# Patient Record
Sex: Female | Born: 1975 | Race: White | Hispanic: No | Marital: Married | Smoking: Never smoker
Health system: Southern US, Community
[De-identification: ages and names within clinical notes are randomized; demographics above are authoritative.]

## PROBLEM LIST (undated history)

## (undated) DIAGNOSIS — E785 Hyperlipidemia, unspecified: Secondary | ICD-10-CM

## (undated) DIAGNOSIS — C49A0 Gastrointestinal stromal tumor, unspecified site: Secondary | ICD-10-CM

## (undated) DIAGNOSIS — E119 Type 2 diabetes mellitus without complications: Secondary | ICD-10-CM

## (undated) HISTORY — DX: Type 2 diabetes mellitus without complications: E11.9

## (undated) HISTORY — PX: TONSILLECTOMY AND ADENOIDECTOMY: SUR1326

## (undated) HISTORY — PX: MYRINGOTOMY WITH TUBE PLACEMENT: SHX5663

## (undated) HISTORY — DX: Hyperlipidemia, unspecified: E78.5

---
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
## 2004-11-26 ENCOUNTER — Ambulatory Visit: Payer: Self-pay | Admitting: Family Medicine

## 2005-11-05 ENCOUNTER — Ambulatory Visit: Payer: Self-pay | Admitting: Family Medicine

## 2008-04-05 ENCOUNTER — Ambulatory Visit: Payer: Self-pay | Admitting: Internal Medicine

## 2008-04-05 DIAGNOSIS — R030 Elevated blood-pressure reading, without diagnosis of hypertension: Secondary | ICD-10-CM | POA: Insufficient documentation

## 2008-06-15 ENCOUNTER — Ambulatory Visit: Payer: Self-pay | Admitting: Family Medicine

## 2008-06-15 DIAGNOSIS — R109 Unspecified abdominal pain: Secondary | ICD-10-CM

## 2008-06-15 LAB — CONVERTED CEMR LAB
Beta hcg, urine, semiquantitative: NEGATIVE
Ketones, urine, test strip: NEGATIVE
Nitrite: NEGATIVE
Specific Gravity, Urine: <1.005
Urobilinogen, UA: NEGATIVE
WBC Urine, dipstick: NEGATIVE

## 2008-06-17 ENCOUNTER — Encounter: Payer: Self-pay | Admitting: Family Medicine

## 2008-06-19 ENCOUNTER — Encounter: Admission: RE | Admit: 2008-06-19 | Discharge: 2008-06-19 | Payer: Self-pay | Admitting: Family Medicine

## 2008-06-21 ENCOUNTER — Telehealth (INDEPENDENT_AMBULATORY_CARE_PROVIDER_SITE_OTHER): Payer: Self-pay | Admitting: *Deleted

## 2008-06-23 ENCOUNTER — Encounter (INDEPENDENT_AMBULATORY_CARE_PROVIDER_SITE_OTHER): Payer: Self-pay | Admitting: *Deleted

## 2008-06-30 ENCOUNTER — Encounter: Payer: Self-pay | Admitting: Family Medicine

## 2008-06-30 ENCOUNTER — Ambulatory Visit: Payer: Self-pay | Admitting: Family Medicine

## 2008-06-30 ENCOUNTER — Other Ambulatory Visit: Admission: RE | Admit: 2008-06-30 | Discharge: 2008-06-30 | Payer: Self-pay | Admitting: Family Medicine

## 2008-06-30 LAB — CONVERTED CEMR LAB
ALT: 20 units/L (ref 0–35)
Albumin: 3.8 g/dL (ref 3.5–5.2)
Alkaline Phosphatase: 84 units/L (ref 39–117)
Basophils Relative: 0.5 % (ref 0.0–3.0)
Bilirubin, Direct: 0 mg/dL (ref 0.0–0.3)
CO2: 28 meq/L (ref 19–32)
Chloride: 106 meq/L (ref 96–112)
Creatinine, Ser: 0.7 mg/dL (ref 0.4–1.2)
Eosinophils Relative: 0.8 % (ref 0.0–5.0)
Hemoglobin: 13.3 g/dL (ref 12.0–15.0)
LDL Cholesterol: 133 mg/dL — ABNORMAL HIGH (ref 0–99)
Lymphocytes Relative: 24.2 % (ref 12.0–46.0)
MCHC: 33.3 g/dL (ref 30.0–36.0)
MCV: 78.8 fL (ref 78.0–100.0)
Monocytes Absolute: 0.5 10*3/uL (ref 0.1–1.0)
Neutro Abs: 5.1 10*3/uL (ref 1.4–7.7)
Neutrophils Relative %: 67.7 % (ref 43.0–77.0)
RBC: 5.09 M/uL (ref 3.87–5.11)
Sodium: 140 meq/L (ref 135–145)
Total CHOL/HDL Ratio: 7
Total Protein: 7.3 g/dL (ref 6.0–8.3)
Triglycerides: 129 mg/dL (ref 0.0–149.0)
WBC: 7.5 10*3/uL (ref 4.5–10.5)

## 2008-07-03 ENCOUNTER — Encounter (INDEPENDENT_AMBULATORY_CARE_PROVIDER_SITE_OTHER): Payer: Self-pay | Admitting: *Deleted

## 2008-07-04 ENCOUNTER — Encounter (INDEPENDENT_AMBULATORY_CARE_PROVIDER_SITE_OTHER): Payer: Self-pay | Admitting: *Deleted

## 2008-07-07 ENCOUNTER — Ambulatory Visit: Payer: Self-pay | Admitting: Family Medicine

## 2008-08-04 ENCOUNTER — Ambulatory Visit: Payer: Self-pay | Admitting: Family Medicine

## 2010-08-17 ENCOUNTER — Encounter: Payer: Self-pay | Admitting: Internal Medicine

## 2010-08-27 ENCOUNTER — Encounter: Payer: Self-pay | Admitting: Family Medicine

## 2010-08-30 ENCOUNTER — Ambulatory Visit (INDEPENDENT_AMBULATORY_CARE_PROVIDER_SITE_OTHER): Payer: Managed Care, Other (non HMO) | Admitting: Family Medicine

## 2010-08-30 ENCOUNTER — Encounter: Payer: Self-pay | Admitting: Family Medicine

## 2010-08-30 DIAGNOSIS — R319 Hematuria, unspecified: Secondary | ICD-10-CM

## 2010-08-30 DIAGNOSIS — Z0000 Encounter for general adult medical examination without abnormal findings: Secondary | ICD-10-CM

## 2010-08-30 DIAGNOSIS — R002 Palpitations: Secondary | ICD-10-CM

## 2010-08-30 LAB — LIPID PANEL
HDL: 34.1 mg/dL — ABNORMAL LOW (ref >39.00)
LDL Cholesterol: 121 mg/dL — ABNORMAL HIGH (ref 0–99)
VLDL: 27.4 mg/dL (ref 0.0–40.0)

## 2010-08-30 LAB — CBC WITH DIFFERENTIAL/PLATELET
Basophils Absolute: 0 10*3/uL (ref 0.0–0.1)
Eosinophils Absolute: 0.1 10*3/uL (ref 0.0–0.7)
Lymphocytes Relative: 24.1 % (ref 12.0–46.0)
MCHC: 33.5 g/dL (ref 30.0–36.0)
Neutro Abs: 5.1 10*3/uL (ref 1.4–7.7)
Neutrophils Relative %: 68.2 % (ref 43.0–77.0)
Platelets: 281 10*3/uL (ref 150.0–400.0)
RDW: 17.8 % — ABNORMAL HIGH (ref 11.5–14.6)

## 2010-08-30 LAB — HEPATIC FUNCTION PANEL
Albumin: 3.6 g/dL (ref 3.5–5.2)
Alkaline Phosphatase: 86 U/L (ref 39–117)
Bilirubin, Direct: 0.1 mg/dL (ref 0.0–0.3)
Total Bilirubin: 0.4 mg/dL (ref 0.3–1.2)

## 2010-08-30 LAB — BASIC METABOLIC PANEL
CO2: 27 mEq/L (ref 19–32)
Calcium: 8.9 mg/dL (ref 8.4–10.5)
Creatinine, Ser: 0.5 mg/dL (ref 0.4–1.2)
GFR: 136.35 mL/min (ref >60.00)
Glucose, Bld: 82 mg/dL (ref 70–99)

## 2010-08-30 LAB — POCT URINALYSIS DIPSTICK
Ketones, UA: NEGATIVE
Leukocytes, UA: NEGATIVE
Protein, UA: NEGATIVE
pH, UA: 5

## 2010-08-30 NOTE — Progress Notes (Signed)
Addended by: Legrand Como on: 08/30/2010 10:23 AM   Modules accepted: Orders

## 2010-08-30 NOTE — Patient Instructions (Signed)

## 2010-08-30 NOTE — Progress Notes (Signed)
  Subjective:     Sarah Fernandez is a 35 y.o. female and is here for a comprehensive physical exam. The patient reports problems - + palpatations last week---for about 12 hours.  Pt was under stress and she thinks it was a panic attack..  History   Social History  . Marital Status: Married    Spouse Name: N/A    Number of Children: N/A  . Years of Education: N/A   Occupational History  . Not on file.   Social History Main Topics  . Smoking status: Never Smoker   . Smokeless tobacco: Never Used  . Alcohol Use: No  . Drug Use: No  . Sexually Active: Yes -- Female partner(s)   Other Topics Concern  . Not on file   Social History Narrative  . No narrative on file   Health Maintenance  Topic Date Due  . Pap Smear  07/01/2011  . Tetanus/tdap  06/19/2015    The following portions of the patient's history were reviewed and updated as appropriate: allergies, current medications, past family history, past medical history, past social history, past surgical history and problem list.  Review of Systems Review of Systems  Constitutional: Negative for activity change, appetite change and fatigue.  HENT: Negative for hearing loss, congestion, tinnitus and ear discharge.  dentist-- due Eyes: Negative for visual disturbance (see optho q1y -- vision 20/20).  Respiratory: Negative for cough, chest tightness and shortness of breath.   Cardiovascular: Negative for chest pain, edema----+ palpatations last week. Gastrointestinal: Negative for abdominal pain, diarrhea, constipation and abdominal distention.  Genitourinary: Negative for urgency, frequency, decreased urine volume and difficulty urinating.  Musculoskeletal: Negative for back pain, arthralgias and gait problem.  Skin: Negative for color change, pallor and rash.  Neurological: Negative for dizziness, light-headedness, numbness and headaches.  Hematological: Negative for adenopathy. Does not bruise/bleed easily.  Psychiatric/Behavioral:  Negative for suicidal ideas, confusion, sleep disturbance, self-injury, dysphoric mood, decreased concentration and agitation.      Objective:    BP 118/74  Pulse 80  Temp(Src) 97.9 F (36.6 C) (Oral)  Ht 5' 4.75" (1.645 m)  Wt 213 lb (96.616 kg)  BMI 35.72 kg/m2  SpO2 96%  LMP 08/30/2010 General appearance: alert, cooperative, appears stated age, no distress and moderately obese Head: Normocephalic, without obvious abnormality, atraumatic Eyes: conjunctivae/corneas clear. PERRL, EOM's intact. Fundi benign. Ears: normal TM's and external ear canals both ears Nose: Nares normal. Septum midline. Mucosa normal. No drainage or sinus tenderness. Throat: lips, mucosa, and tongue normal; teeth and gums normal Neck: no adenopathy, no carotid bruit, no JVD, supple, symmetrical, trachea midline and thyroid not enlarged, symmetric, no tenderness/mass/nodules Lungs: clear to auscultation bilaterally Breasts: normal appearance, no masses or tenderness Heart: regular rate and rhythm, S1, S2 normal, no murmur, click, rub or gallop Abdomen: soft, non-tender; bowel sounds normal; no masses,  no organomegaly Pelvic: deferred --pt on period Extremities: extremities normal, atraumatic, no cyanosis or edema Pulses: 2+ and symmetric Skin: Skin color, texture, turgor normal. No rashes or lesions Lymph nodes: Cervical, supraclavicular, and axillary nodes normal. Neurologic: Grossly normal psych-- no depression or anxiety today---pt states she does get anxious    Assessment:    Healthy female exam.  palpatations---? anxiety   Plan:  ghm utd Check fasting labs If pt develops cp or increasing palp--go to er   See After Visit Summary for Counseling Recommendations

## 2010-09-11 ENCOUNTER — Other Ambulatory Visit: Payer: Self-pay | Admitting: Family Medicine

## 2010-09-25 ENCOUNTER — Encounter: Payer: Self-pay | Admitting: Family Medicine

## 2010-10-03 ENCOUNTER — Other Ambulatory Visit: Payer: Self-pay | Admitting: Internal Medicine

## 2010-10-03 ENCOUNTER — Telehealth: Payer: Self-pay | Admitting: Internal Medicine

## 2010-10-03 DIAGNOSIS — Z23 Encounter for immunization: Secondary | ICD-10-CM

## 2010-10-03 NOTE — Telephone Encounter (Signed)
Informed pt she had 3 in 2010, we could redo the series but she would not be covered before the trip-- pt wants to have Hep A & B antibody done to see if she immune from the shots given in 2010.

## 2010-10-04 ENCOUNTER — Other Ambulatory Visit (INDEPENDENT_AMBULATORY_CARE_PROVIDER_SITE_OTHER): Payer: Managed Care, Other (non HMO)

## 2010-10-04 DIAGNOSIS — Z23 Encounter for immunization: Secondary | ICD-10-CM

## 2010-10-04 NOTE — Progress Notes (Signed)
LABS ONLY  

## 2010-10-05 LAB — HEPATITIS B SURFACE ANTIBODY,QUALITATIVE

## 2010-10-05 LAB — HEPATITIS A ANTIBODY, IGM: Hep A IgM: NEGATIVE

## 2010-10-07 ENCOUNTER — Ambulatory Visit: Payer: Managed Care, Other (non HMO) | Admitting: Family Medicine

## 2010-10-07 ENCOUNTER — Telehealth: Payer: Self-pay

## 2010-10-07 MED ORDER — ACETAZOLAMIDE 250 MG PO TABS: ORAL_TABLET | ORAL | Status: DC

## 2010-10-07 NOTE — Telephone Encounter (Signed)
Pt states that she will be gone from this Wednesday to next Wednesday. Pt aware Rx sent in to pharmacy.

## 2010-10-07 NOTE — Telephone Encounter (Signed)
Diamox 250 mg twice a day beginning one day prior to ascent  and continuing for at least 5 days while at high altitude. umber of pills will need to be calculated based on  length of time at  high altitude. LABS: LDL is mildly elevated. Please review Dr Gildardo Griffes book Eat, Drink & Be Healthy for dietary cholesterol information.  HDL goal = > 50.Interventions to raise HDL or GOOD cholesterol include: exercising 30-45 minutes 3-4 X per week; including salmon & tuna in the diet;  & supplementing with Omega 3 fatty acids (Flax or Fish oil )  1-2 grams per day. The B vitamin Niacin also raises HDL but has a vasodilating effect which may cause flushing. MCV & RDW measure size of red cells ;these are  not important unless anemia is present.

## 2010-10-07 NOTE — Telephone Encounter (Signed)
Call from patient stating she is going on a mission trip on Wednesday and needs and RX for Diamox  for high altitude and something for nausea, she wanted to know should she take the Diamox or just Gingko Biloba. She also needs lab results. Please advise    KP

## 2010-10-08 ENCOUNTER — Ambulatory Visit (INDEPENDENT_AMBULATORY_CARE_PROVIDER_SITE_OTHER): Payer: Managed Care, Other (non HMO)

## 2010-10-08 DIAGNOSIS — Z23 Encounter for immunization: Secondary | ICD-10-CM

## 2010-10-09 ENCOUNTER — Ambulatory Visit: Payer: Managed Care, Other (non HMO)

## 2010-11-08 ENCOUNTER — Ambulatory Visit: Payer: Managed Care, Other (non HMO) | Admitting: Family Medicine

## 2010-12-10 ENCOUNTER — Ambulatory Visit (INDEPENDENT_AMBULATORY_CARE_PROVIDER_SITE_OTHER): Payer: Managed Care, Other (non HMO) | Admitting: Family Medicine

## 2010-12-10 ENCOUNTER — Encounter: Payer: Self-pay | Admitting: Family Medicine

## 2010-12-10 ENCOUNTER — Other Ambulatory Visit (HOSPITAL_COMMUNITY)
Admission: RE | Admit: 2010-12-10 | Discharge: 2010-12-10 | Disposition: A | Discharge status: Home / Self Care | Payer: Managed Care, Other (non HMO) | Source: Ambulatory Visit | Attending: Family Medicine | Admitting: Family Medicine

## 2010-12-10 VITALS — BP 120/90 | HR 77 | Temp 98.7°F | Ht 65.0 in | Wt 214.2 lb

## 2010-12-10 DIAGNOSIS — Z01419 Encounter for gynecological examination (general) (routine) without abnormal findings: Secondary | ICD-10-CM

## 2010-12-10 DIAGNOSIS — Z23 Encounter for immunization: Secondary | ICD-10-CM

## 2010-12-10 NOTE — Patient Instructions (Signed)
We will mail you a letter when we receive your pap smear results

## 2010-12-10 NOTE — Progress Notes (Signed)
  Subjective:     Sarah Fernandez is a 35 y.o. woman who comes in today for a  pap smear only. Her most recent annual exam was in June. Her most recent Pap smear was a year ago and showed no abnormalities. Previous abnormal Pap smears: no. Contraception: none  The following portions of the patient's history were reviewed and updated as appropriate: allergies, current medications, past family history, past medical history, past social history, past surgical history and problem list.  Review of Systems Pertinent items are noted in HPI.   Objective:    BP 120/90  Pulse 77  Temp(Src) 98.7 F (37.1 C) (Oral)  Ht 5\' 5"  (1.651 m)  Wt 214 lb 3.2 oz (97.16 kg)  BMI 35.64 kg/m2  SpO2 95%  LMP 11/30/2010 Pelvic Exam: cervix normal in appearance, external genitalia normal, no adnexal masses or tenderness, no bladder tenderness, no cervical motion tenderness, uterus normal size, shape, and consistency and vagina normal without discharge. Pap smear obtained.   Assessment:    Screening pap smear.   Plan:    Follow up in 1 year, or as indicated by Pap results.

## 2010-12-12 ENCOUNTER — Encounter: Payer: Self-pay | Admitting: *Deleted

## 2011-04-04 ENCOUNTER — Encounter: Payer: Self-pay | Admitting: Family Medicine

## 2011-04-04 ENCOUNTER — Ambulatory Visit (INDEPENDENT_AMBULATORY_CARE_PROVIDER_SITE_OTHER): Payer: Managed Care, Other (non HMO) | Admitting: Family Medicine

## 2011-04-04 VITALS — BP 114/72 | HR 91 | Temp 98.8°F | Wt 217.4 lb

## 2011-04-04 DIAGNOSIS — H7290 Unspecified perforation of tympanic membrane, unspecified ear: Secondary | ICD-10-CM

## 2011-04-04 DIAGNOSIS — J329 Chronic sinusitis, unspecified: Secondary | ICD-10-CM

## 2011-04-04 MED ORDER — AZELASTINE-FLUTICASONE 137-50 MCG/ACT NA SUSP: 1.0000 | Freq: Two times a day (BID) | NASAL | Status: DC

## 2011-04-04 MED ORDER — AMOXICILLIN-POT CLAVULANATE 875-125 MG PO TABS: 1.0000 | ORAL_TABLET | Freq: Two times a day (BID) | ORAL | Status: AC

## 2011-04-04 NOTE — Patient Instructions (Signed)

## 2011-04-04 NOTE — Progress Notes (Signed)
  Subjective:     Sarah Fernandez is a 36 y.o. female who presents for evaluation of sinus pain. Symptoms include: congestion, cough, headaches, nasal congestion and sinus pressure. Onset of symptoms was 1 month ago. Symptoms have been gradually worsening since that time. Past history is significant for no history of pneumonia or bronchitis. Patient is a non-smoker.  Pt states zyrtec is not working.  She is also using neti pot and delsym with no relief.  The following portions of the patient's history were reviewed and updated as appropriate: allergies, current medications, past family history, past medical history, past social history, past surgical history and problem list.  Review of Systems Pertinent items are noted in HPI.   Objective:    BP 114/72  Pulse 91  Temp(Src) 98.8 F (37.1 C) (Oral)  Wt 217 lb 6.4 oz (98.612 kg)  SpO2 96% General appearance: alert, cooperative, appears stated age and no distress Ears: abnormal TM left ear - perforation: - - Nose: blood tinged and green discharge, moderate congestion, sinus tenderness bilateral Throat: lips, mucosa, and tongue normal; teeth and gums normal Neck: no adenopathy and thyroid not enlarged, symmetric, no tenderness/mass/nodules Lungs: clear to auscultation bilaterally Extremities: extremities normal, atraumatic, no cyanosis or edema    Assessment:    Acute bacterial sinusitis.   ruptured tm---refer to Ent for eval Plan:    Neti pot recommended. Instructions given. Nasal steroids per medication orders. Antihistamines per medication orders. Augmentin per medication orders. Referred to ENT. Follow up in 4 days or as needed. ----only if needed

## 2011-04-16 ENCOUNTER — Telehealth: Payer: Self-pay | Admitting: Family Medicine

## 2011-04-16 NOTE — Telephone Encounter (Signed)
Would try Nasonex- 2 sprays each nostril daily.  Can have sample

## 2011-04-16 NOTE — Telephone Encounter (Signed)
The nasal spray burns her nose, do you have any other suggestions for nasal sprays?

## 2011-04-16 NOTE — Telephone Encounter (Signed)
Pt completed 10 day course of Augmentin- this is the stronger amox.  It sounds like her symptoms have improved somewhat and the symptoms that remain are more allergic sounding than infectious.  She should be using Claritin or Zyrtec over the counter daily, drinking plenty of fluids.  Has allergy nasal spray on med list- she should be using this as well.  If symptoms don't improve in the next week, she should let us know.

## 2011-04-16 NOTE — Telephone Encounter (Signed)
Discussed with patient and she agreed to Dr.Tabori's recommendations--Nasonex sample left at check in for husband to pick up.   KP

## 2011-04-16 NOTE — Telephone Encounter (Signed)
Spoke with patient and she stated she is a little better but her throat is sore, some head pressure sinus drainage and congestion. Denied fevers. Patient was seen 04/04/11 and she had completed the 10 day course of Amox. Please advise in Dr.Lowne's absence    KP

## 2011-04-16 NOTE — Telephone Encounter (Signed)
The pt called and is hoping to get a stronger ammoxcilin (spelling??)   Thanks!

## 2011-04-18 ENCOUNTER — Telehealth: Payer: Self-pay

## 2011-04-18 MED ORDER — CLARITHROMYCIN ER 500 MG PO TB24: 1000.0000 mg | ORAL_TABLET | Freq: Every day | ORAL | Status: AC

## 2011-04-18 NOTE — Telephone Encounter (Signed)
Husband saw Dr. Drue Novel today and came over and stated that patient still has the drainage and green mucus from the nose and the claritin is drying her up some but still not much relief. She would like to get another Antibiotic.-- Per Dr.Lowne OK to sen Biaxin XL 500 1 po bid x's 10 days---Rx faxed    KP

## 2011-10-15 ENCOUNTER — Encounter: Payer: Self-pay | Admitting: Family Medicine

## 2011-10-15 ENCOUNTER — Ambulatory Visit (INDEPENDENT_AMBULATORY_CARE_PROVIDER_SITE_OTHER): Payer: Managed Care, Other (non HMO) | Admitting: Family Medicine

## 2011-10-15 VITALS — BP 130/82 | HR 95 | Temp 98.9°F | Ht 65.0 in | Wt 216.8 lb

## 2011-10-15 DIAGNOSIS — J019 Acute sinusitis, unspecified: Secondary | ICD-10-CM

## 2011-10-15 MED ORDER — AMOXICILLIN 875 MG PO TABS: 875.0000 mg | ORAL_TABLET | Freq: Two times a day (BID) | ORAL | Status: AC

## 2011-10-15 NOTE — Assessment & Plan Note (Signed)
New.  sxs and PE consistent w/ infxn.  Start abx.  Reviewed supportive care and red flags that should prompt return.  Pt expressed understanding and is in agreement w/ plan.  

## 2011-10-15 NOTE — Progress Notes (Signed)
  Subjective:    Patient ID: Sarah Fernandez, female    DOB: 24-Aug-1975, 36 y.o.   MRN: [id number]  HPI URI- sxs started 6 days ago.  + sinus pressure, nasal congestion, dry cough, sore throat.  No ear pain.  + swollen glands- these are improving.  No fevers.  + sick contacts.  Initially thought sxs were allergy related but no relief w/ claritin.  No tooth pain.   Review of Systems For ROS see HPI     Objective:   Physical Exam  Vitals reviewed. Constitutional: She appears well-developed and well-nourished. No distress.  HENT:  Head: Normocephalic and atraumatic.  Right Ear: Tympanic membrane normal.  Left Ear: Tympanic membrane normal.  Nose: Mucosal edema and rhinorrhea present. Right sinus exhibits maxillary sinus tenderness. Right sinus exhibits no frontal sinus tenderness. Left sinus exhibits maxillary sinus tenderness. Left sinus exhibits no frontal sinus tenderness.  Mouth/Throat: Uvula is midline and mucous membranes are normal. Posterior oropharyngeal erythema present. No oropharyngeal exudate.  Eyes: Conjunctivae and EOM are normal. Pupils are equal, round, and reactive to light.  Neck: Normal range of motion. Neck supple.  Cardiovascular: Normal rate, regular rhythm and normal heart sounds.   Pulmonary/Chest: Effort normal and breath sounds normal. No respiratory distress. She has no wheezes.  Lymphadenopathy:    She has no cervical adenopathy.          Assessment & Plan:

## 2011-10-15 NOTE — Patient Instructions (Addendum)
This is a sinus infection Start the Amoxicillin twice daily w/ food Drink plenty of fluids Mucinex to thin your congestion Delsym or Robitussin as needed for cough Continue the Claritin REST! Hang in there!!!

## 2011-11-24 ENCOUNTER — Telehealth: Payer: Self-pay | Admitting: Family Medicine

## 2011-11-24 NOTE — Telephone Encounter (Signed)
Please offer this patient an apt tomorrow...   KP

## 2011-11-24 NOTE — Telephone Encounter (Signed)
For MD to review      KP 

## 2011-11-24 NOTE — Telephone Encounter (Signed)
Caller: [name]/Patient; Patient Name: Sarah Fernandez; PCP: Lelon Perla.; Best Callback Phone Number: [phone]. LMP 11/23/11. Call regarding: runny nose/nasal congestion/sinus pressure/productive cough with green mucus. Afebrile. Has been taking Claritin, Delsym, and Aleve for symptoms with some improvement noted. Feels pressure to bridge of her nose at this time. Emergent symptom of "Productive cough with colored sputum" positive per Upper Respiratory Infection guideline. Disposition: See provider within 24 hours. Offered to schedule appointment, but patient reports that she has to speak to her work in order to know a time when she could come in and be seen. Care advice and call back parameters given per guideline. Instructed her to call back if any new symptoms develop or any current symptoms worsen. Patient verbalized understanding.

## 2011-11-24 NOTE — Telephone Encounter (Signed)
Called pt at number given to CAN went straight to VM left mess to call today or tomorrow early to get in for acute 15-minute visit with dr.lowne

## 2011-11-24 NOTE — Telephone Encounter (Signed)
We can see her tomorrow

## 2011-11-25 NOTE — Telephone Encounter (Signed)
Multiple attempts to contact the patient without success--   Sarah Fernandez

## 2011-11-25 NOTE — Telephone Encounter (Signed)
9.24.13 1133am called patient again at number listed to offer afternoon appt from CAN note below went to VM again Left another message

## 2012-02-06 ENCOUNTER — Encounter: Payer: Self-pay | Admitting: Family

## 2012-02-06 ENCOUNTER — Ambulatory Visit (INDEPENDENT_AMBULATORY_CARE_PROVIDER_SITE_OTHER): Payer: Managed Care, Other (non HMO) | Admitting: Family

## 2012-02-06 VITALS — BP 134/90 | HR 97 | Temp 98.6°F | Resp 16 | Wt 219.0 lb

## 2012-02-06 DIAGNOSIS — N926 Irregular menstruation, unspecified: Secondary | ICD-10-CM

## 2012-02-06 LAB — POCT URINE HCG BY VISUAL COLOR COMPARISON TESTS: Preg Test, Ur: NEGATIVE

## 2012-02-06 NOTE — Assessment & Plan Note (Addendum)
36 yr old female presents today with report of irregular menses. I have recommended that she see GYN to further evaluate.  With her family hx it is possible that she could be perimenopausal.  She will contact us with the name of the GYN to whom she would like to be referred.  A pregnancy HCG performed today is negative.

## 2012-02-06 NOTE — Progress Notes (Signed)
  Subjective:    Patient ID: Sarah Fernandez, female    DOB: 07-03-1975, 36 y.o.   MRN: [id number]  HPI  Ms.  Steele is a 36 yr old female who presents today with report of irregular menses.  Period started on 11/27. Typically period 5-7 days.  She reports that she has tried ocp's in the past. Sarah Fernandez- caused  Today is the 9th day.  She spotted for 1 day on 11/24.  Stopped.  Normal menstrual flow started  On 11/28.  Now she is still bleeding.  Some hot flashes.  Aunt had premature ovarian failure in her 30's     Review of Systems    see HPI  No past medical history on file.  History   Social History  . Marital Status: Married    Spouse Name: N/A    Number of Children: N/A  . Years of Education: N/A   Occupational History  . Not on file.   Social History Main Topics  . Smoking status: Never Smoker   . Smokeless tobacco: Never Used  . Alcohol Use: No  . Drug Use: No  . Sexually Active: Yes -- Female partner(s)   Other Topics Concern  . Not on file   Social History Narrative  . No narrative on file    No past surgical history on file.  Family History  Problem Relation Age of Onset  . Diabetes Mother   . Hypertension Father   . Heart disease Maternal Grandmother     MI  . Heart disease Maternal Grandfather 6    MI    Allergies  Allergen Reactions  . Flonase (Fluticasone Propionate)     Burned her nose     Current Outpatient Prescriptions on File Prior to Visit  Medication Sig Dispense Refill  . loratadine (CLARITIN) 10 MG tablet Take 10 mg by mouth daily.      . Multiple Vitamin (MULTIVITAMIN) capsule Take 1 capsule by mouth daily.          BP 134/90  Pulse 97  Temp 98.6 F (37 C) (Oral)  Resp 16  Wt 219 lb (99.338 kg)  SpO2 98%  LMP 01/28/2012    Objective:   Physical Exam  Constitutional: She is oriented to person, place, and time. She appears well-developed and well-nourished. No distress.  Cardiovascular: Normal rate and regular rhythm.   No murmur  heard. Pulmonary/Chest: Effort normal and breath sounds normal. No respiratory distress. She has no wheezes. She has no rales. She exhibits no tenderness.  Musculoskeletal: She exhibits no edema.  Neurological: She is alert and oriented to person, place, and time.  Psychiatric: She has a normal mood and affect. Her behavior is normal. Judgment and thought content normal.          Assessment & Plan:

## 2012-02-06 NOTE — Addendum Note (Signed)
Addended by: Mervin Kung A on: 02/06/2012 03:39 PM   Modules accepted: Orders

## 2012-02-06 NOTE — Patient Instructions (Addendum)
Please call us with name of GYN you would like to be referred to.

## 2012-02-09 NOTE — Addendum Note (Signed)
Addended by: Sandford Craze on: 02/09/2012 09:47 AM   Modules accepted: Orders

## 2018-08-05 NOTE — Progress Notes (Signed)
Subjective:    Patient ID: Sarah Fernandez, female    DOB: 07-Jun-1975, 43 y.o.   MRN: [id number]  HPI:  Sarah Fernandez is here to establish as a new pt.  She is a pleasant 43 year old female. PMH: Elevated BP without dx of HTN- most likely r/t to "[place] [employer] [place] readings: SBP 120s DBP 70s Syndrome" She has lost >30 lbs in last 12 months due to improved eating and using "My Net Cal" App on her smart phone- total cals/per day is set at 1850 She estimates to drink >60 oz water/day She walks 25-30 mins/day, not currently walking to acute R ankle injury sustained last week- healing well. She denies regular rx medication She is taking ABX for scheduled root canal next week She denies tobacco/vape/ETOH use She is married She works remotely [employer]   Patient Care Team    Relationship Specialty Notifications Start End  Dilana Mcphie, Orpha Bur D, NP PCP - General Family Medicine  08/09/18   Donato Schultz, DO Consulting Physician Family Medicine  08/09/18   Mitchel Honour, DO Consulting Physician Obstetrics and Gynecology  08/09/18   Conley Rolls, [place], [name] Referring Physician Optometry  08/09/18     Patient Active Problem List   Diagnosis Date Noted  . Healthcare maintenance 08/09/2018  . BMI 33.0-33.9,adult 08/09/2018  . Irregular menses 02/06/2012  . ELEVATED BLOOD PRESSURE WITHOUT DIAGNOSIS OF HYPERTENSION 04/05/2008     History reviewed. No pertinent past medical history.   Past Surgical History:  Procedure Laterality Date  . MYRINGOTOMY WITH TUBE PLACEMENT    . TONSILLECTOMY AND ADENOIDECTOMY       Family History  Problem Relation Age of Onset  . Diabetes Mother   . Hypertension Mother   . Hyperlipidemia Mother   . Hypertension Father   . Diabetes Father   . Cancer Father        lung  . Hyperlipidemia Father   . Heart disease Maternal Grandmother        MI  . Heart disease Maternal Grandfather 47       MI  . Heart attack Maternal Grandfather   . Diabetes Sister      Social History    Substance and Sexual Activity  Drug Use No     Social History   Substance and Sexual Activity  Alcohol Use No     Social History   Tobacco Use  Smoking Status Never Smoker  Smokeless Tobacco Never Used     Outpatient Encounter Medications as of 08/09/2018  Medication Sig  . acetaminophen (TYLENOL) 500 MG tablet Take 500 mg by mouth every 6 (six) hours as needed.  Marland Kitchen amoxicillin (AMOXIL) 500 MG capsule Take 500 mg by mouth 4 (four) times daily.  . famotidine (PEPCID) 20 MG tablet Take 20 mg by mouth daily.  Bertram Gala Glycol-Propyl Glycol (SYSTANE) 0.4-0.3 % SOLN Apply 1 drop to eye daily.  . [DISCONTINUED] loratadine (CLARITIN) 10 MG tablet Take 10 mg by mouth daily.  . [DISCONTINUED] Multiple Vitamin (MULTIVITAMIN) capsule Take 1 capsule by mouth daily.     No facility-administered encounter medications on file as of 08/09/2018.     Allergies: Flonase [fluticasone propionate]  Body mass index is 33.61 kg/m.  Blood pressure 124/84, pulse 99, temperature 98.4 F (36.9 C), temperature source Oral, height 5' 4.5" (1.638 m), weight 198 lb 14.4 oz (90.2 kg), last menstrual period 07/23/2018, SpO2 96 %.      Review of Systems  Constitutional: Positive for fatigue.  Negative for activity change, appetite change, chills, diaphoresis, fever and unexpected weight change.  HENT: Positive for congestion and postnasal drip. Negative for ear discharge, ear pain and rhinorrhea.   Eyes: Negative for visual disturbance.  Respiratory: Negative for cough, chest tightness, shortness of breath, wheezing and stridor.   Cardiovascular: Negative for chest pain, palpitations and leg swelling.  Gastrointestinal: Negative for abdominal distention, anal bleeding, blood in stool, constipation, diarrhea, nausea and vomiting.  Endocrine: Negative for cold intolerance, heat intolerance, polydipsia, polyphagia and polyuria.  Genitourinary: Negative for flank pain.  Musculoskeletal: Positive for  arthralgias and gait problem.  Skin: Negative for color change, pallor, rash and wound.  Neurological: Negative for dizziness and light-headedness.  Hematological: Does not bruise/bleed easily.  Psychiatric/Behavioral: Negative for agitation, behavioral problems, confusion, decreased concentration, dysphoric mood, hallucinations, self-injury, sleep disturbance and suicidal ideas. The patient is not nervous/anxious and is not hyperactive.        Objective:   Physical Exam Vitals signs and nursing note reviewed.  Constitutional:      General: She is not in acute distress.    Appearance: Normal appearance. She is not ill-appearing, toxic-appearing or diaphoretic.  HENT:     Right Ear: No decreased hearing noted.     Left Ear: No decreased hearing noted. Tympanic membrane is perforated.     Ears:     Comments: Well healed, old perforation noted in L ear  Eyes:     Extraocular Movements: Extraocular movements intact.     Conjunctiva/sclera: Conjunctivae normal.     Pupils: Pupils are equal, round, and reactive to light.  Cardiovascular:     Rate and Rhythm: Normal rate.     Pulses: Normal pulses.     Heart sounds: Normal heart sounds. No murmur. No friction rub. No gallop.   Pulmonary:     Effort: Pulmonary effort is normal. No respiratory distress.     Breath sounds: Normal breath sounds. No wheezing, rhonchi or rales.  Chest:     Chest wall: No tenderness.  Skin:    Capillary Refill: Capillary refill takes less than 2 seconds.  Neurological:     Mental Status: She is alert and oriented to person, place, and time.  Psychiatric:        Mood and Affect: Mood normal.        Behavior: Behavior normal.        Thought Content: Thought content normal.        Judgment: Judgment normal.        Assessment & Plan:   1. Healthcare maintenance   2. Well female exam with routine gynecological exam   3. ELEVATED BLOOD PRESSURE WITHOUT DIAGNOSIS OF HYPERTENSION   4. BMI 33.0-33.9,adult      Healthcare maintenance Increase water intake, follow Mediterranean Diet, and once your right ankle is healed- resume regular walking. Overall you are doing a very good job taking care of yourself! Please schedule complete physical in 2 months, fasting lab appt the week prior. Continue to social distance and wear a mask when in public.  ELEVATED BLOOD PRESSURE WITHOUT DIAGNOSIS OF HYPERTENSION BP 124/84, HR 99 Home readings: SBP 120s DBP 70s  BMI 33.0-33.9,adult Resume regular walking when R ankle is healed Continue using My Net Cal App Current wt 198 Body mass index is 33.61 kg/m.    FOLLOW-UP:  Return in about 2 months (around 10/09/2018) for CPE, Fasting Labs.

## 2018-08-09 ENCOUNTER — Encounter: Payer: Self-pay | Admitting: Adult Health

## 2018-08-09 ENCOUNTER — Other Ambulatory Visit: Payer: Self-pay

## 2018-08-09 ENCOUNTER — Ambulatory Visit (INDEPENDENT_AMBULATORY_CARE_PROVIDER_SITE_OTHER): Payer: 59 | Admitting: Adult Health

## 2018-08-09 VITALS — BP 124/84 | HR 99 | Temp 98.4°F | Ht 64.5 in | Wt 198.9 lb

## 2018-08-09 DIAGNOSIS — Z01419 Encounter for gynecological examination (general) (routine) without abnormal findings: Secondary | ICD-10-CM | POA: Diagnosis not present

## 2018-08-09 DIAGNOSIS — Z6833 Body mass index (BMI) 33.0-33.9, adult: Secondary | ICD-10-CM | POA: Diagnosis not present

## 2018-08-09 DIAGNOSIS — R030 Elevated blood-pressure reading, without diagnosis of hypertension: Secondary | ICD-10-CM

## 2018-08-09 DIAGNOSIS — Z0000 Encounter for general adult medical examination without abnormal findings: Secondary | ICD-10-CM | POA: Insufficient documentation

## 2018-08-09 NOTE — Assessment & Plan Note (Signed)
BP 124/84, HR 99 Home readings: SBP 120s DBP 70s

## 2018-08-09 NOTE — Patient Instructions (Signed)
Mediterranean Diet A Mediterranean diet refers to food and lifestyle choices that are based on the traditions of countries located on the [employer]. This way of eating has been shown to help prevent certain conditions and improve outcomes for people who have chronic diseases, like kidney disease and heart disease. What are tips for following this plan? Lifestyle  Cook and eat meals together with your family, when possible.  Drink enough fluid to keep your urine clear or pale yellow.  Be physically active every day. This includes: ? Aerobic exercise like running or swimming. ? Leisure activities like gardening, walking, or housework.  Get 7-8 hours of sleep each night.  If recommended by your health care provider, drink red wine in moderation. This means 1 glass a day for nonpregnant women and 2 glasses a day for men. A glass of wine equals 5 oz (150 mL). Reading food labels   Check the serving size of packaged foods. For foods such as rice and pasta, the serving size refers to the amount of cooked product, not dry.  Check the total fat in packaged foods. Avoid foods that have saturated fat or trans fats.  Check the ingredients list for added sugars, such as corn syrup. Shopping  At the grocery store, buy most of your food from the areas near the walls of the store. This includes: ? Fresh fruits and vegetables (produce). ? Grains, beans, nuts, and seeds. Some of these may be available in unpackaged forms or large amounts (in bulk). ? Fresh seafood. ? Poultry and eggs. ? Low-fat dairy products.  Buy whole ingredients instead of prepackaged foods.  Buy fresh fruits and vegetables in-season from local farmers markets.  Buy frozen fruits and vegetables in resealable bags.  If you do not have access to quality fresh seafood, buy precooked frozen shrimp or canned fish, such as tuna, salmon, or sardines.  Buy small amounts of raw or cooked vegetables, salads, or olives from  the deli or salad bar at your store.  Stock your pantry so you always have certain foods on hand, such as olive oil, canned tuna, canned tomatoes, rice, pasta, and beans. Cooking  Cook foods with extra-virgin olive oil instead of using butter or other vegetable oils.  Have meat as a side dish, and have vegetables or grains as your main dish. This means having meat in small portions or adding small amounts of meat to foods like pasta or stew.  Use beans or vegetables instead of meat in common dishes like chili or lasagna.  Experiment with different cooking methods. Try roasting or broiling vegetables instead of steaming or sauteing them.  Add frozen vegetables to soups, stews, pasta, or rice.  Add nuts or seeds for added healthy fat at each meal. You can add these to yogurt, salads, or vegetable dishes.  Marinate fish or vegetables using olive oil, lemon juice, garlic, and fresh herbs. Meal planning   Plan to eat 1 vegetarian meal one day each week. Try to work up to 2 vegetarian meals, if possible.  Eat seafood 2 or more times a week.  Have healthy snacks readily available, such as: ? Vegetable sticks with hummus. ? [place] yogurt. ? Fruit and nut trail mix.  Eat balanced meals throughout the week. This includes: ? Fruit: 2-3 servings a day ? Vegetables: 4-5 servings a day ? Low-fat dairy: 2 servings a day ? Fish, poultry, or lean meat: 1 serving a day ? Beans and legumes: 2 or more servings a week ?  Nuts and seeds: 1-2 servings a day ? Whole grains: 6-8 servings a day ? Extra-virgin olive oil: 3-4 servings a day  Limit red meat and sweets to only a few servings a month What are my food choices?  Mediterranean diet ? Recommended ? Grains: Whole-grain pasta. Brown rice. Bulgar wheat. Polenta. Couscous. Whole-wheat bread. [place]. ? Vegetables: Artichokes. Beets. Broccoli. Cabbage. Carrots. Eggplant. Green beans. Chard. Kale. Spinach. Onions. Leeks. Peas. Squash.  Tomatoes. Peppers. Radishes. ? Fruits: Apples. Apricots. Avocado. Berries. Bananas. Cherries. Dates. Figs. Grapes. Lemons. Melon. Oranges. Peaches. Plums. Pomegranate. ? Meats and other protein foods: Beans. Almonds. Sunflower seeds. Pine nuts. Peanuts. [place]. Salmon. Scallops. Shrimp. [place]. Tilapia. Clams. Oysters. Eggs. ? Dairy: Low-fat milk. Cheese. Greek yogurt. ? Beverages: Water. Red wine. Herbal tea. ? Fats and oils: Extra virgin olive oil. Avocado oil. Grape seed oil. ? Sweets and desserts: [place] yogurt with honey. Baked apples. Poached pears. Trail mix. ? Seasoning and other foods: Basil. Cilantro. Coriander. Cumin. Mint. Parsley. Sage. Rosemary. Tarragon. Garlic. Oregano. Thyme. Pepper. Balsalmic vinegar. Tahini. Hummus. Tomato sauce. Olives. Mushrooms. ? Limit these ? Grains: Prepackaged pasta or rice dishes. Prepackaged cereal with added sugar. ? Vegetables: Deep fried potatoes (french fries). ? Fruits: Fruit canned in syrup. ? Meats and other protein foods: Beef. Pork. Lamb. Poultry with skin. Hot dogs. [place] Salines. ? Dairy: Ice cream. Sour cream. Whole milk. ? Beverages: Juice. Sugar-sweetened soft drinks. Beer. Liquor and spirits. ? Fats and oils: Butter. Canola oil. Vegetable oil. Beef fat (tallow). Lard. ? Sweets and desserts: Cookies. Cakes. Pies. Candy. ? Seasoning and other foods: Mayonnaise. Premade sauces and marinades. ? The items listed may not be a complete list. Talk with your dietitian about what dietary choices are right for you. Summary  The Mediterranean diet includes both food and lifestyle choices.  Eat a variety of fresh fruits and vegetables, beans, nuts, seeds, and whole grains.  Limit the amount of red meat and sweets that you eat.  Talk with your health care provider about whether it is safe for you to drink red wine in moderation. This means 1 glass a day for nonpregnant women and 2 glasses a day for men. A glass of wine equals 5 oz (150 mL). This information  is not intended to replace advice given to you by your health care provider. Make sure you discuss any questions you have with your health care provider. Document Released: 10/11/2015 Document Revised: 11/13/2015 Document Reviewed: 10/11/2015 Elsevier Interactive Patient Education  2019 [place].  Increase water intake, follow Mediterranean Diet, and once your right ankle is healed- resume regular walking. Overall you are doing a very good job taking care of yourself! Please schedule complete physical in 2 months, fasting lab appt the week prior. Continue to social distance and wear a mask when in public. WELCOME TO THE PRACTICE!

## 2018-08-09 NOTE — Assessment & Plan Note (Signed)
Resume regular walking when R ankle is healed Continue using My Net Cal App Current wt 198 Body mass index is 33.61 kg/m.

## 2018-08-09 NOTE — Assessment & Plan Note (Signed)
Increase water intake, follow Mediterranean Diet, and once your right ankle is healed- resume regular walking. Overall you are doing a very good job taking care of yourself! Please schedule complete physical in 2 months, fasting lab appt the week prior. Continue to social distance and wear a mask when in public.

## 2018-10-04 ENCOUNTER — Other Ambulatory Visit: Payer: Self-pay

## 2018-10-04 ENCOUNTER — Other Ambulatory Visit (INDEPENDENT_AMBULATORY_CARE_PROVIDER_SITE_OTHER): Payer: 59

## 2018-10-04 DIAGNOSIS — Z0000 Encounter for general adult medical examination without abnormal findings: Secondary | ICD-10-CM

## 2018-10-05 LAB — CBC WITH DIFFERENTIAL/PLATELET
Basophils Absolute: 0 x10E3/uL (ref 0.0–0.2)
Basos: 0 %
EOS (ABSOLUTE): 0.1 x10E3/uL (ref 0.0–0.4)
Eos: 1 %
Hematocrit: 40.9 % (ref 34.0–46.6)
Hemoglobin: 13 g/dL (ref 11.1–15.9)
Immature Grans (Abs): 0 x10E3/uL (ref 0.0–0.1)
Immature Granulocytes: 0 %
Lymphocytes Absolute: 2.1 x10E3/uL (ref 0.7–3.1)
Lymphs: 23 %
MCH: 24.1 pg — ABNORMAL LOW (ref 26.6–33.0)
MCHC: 31.8 g/dL (ref 31.5–35.7)
MCV: 76 fL — ABNORMAL LOW (ref 79–97)
Monocytes Absolute: 0.6 x10E3/uL (ref 0.1–0.9)
Monocytes: 7 %
Neutrophils Absolute: 6.3 x10E3/uL (ref 1.4–7.0)
Neutrophils: 69 %
Platelets: 271 x10E3/uL (ref 150–450)
RBC: 5.4 x10E6/uL — ABNORMAL HIGH (ref 3.77–5.28)
RDW: 15.8 % — ABNORMAL HIGH (ref 11.7–15.4)
WBC: 9.2 x10E3/uL (ref 3.4–10.8)

## 2018-10-05 LAB — COMPREHENSIVE METABOLIC PANEL WITH GFR
ALT: 18 IU/L (ref 0–32)
AST: 17 IU/L (ref 0–40)
Albumin/Globulin Ratio: 1.6 (ref 1.2–2.2)
Albumin: 4.1 g/dL (ref 3.8–4.8)
Alkaline Phosphatase: 127 IU/L — ABNORMAL HIGH (ref 39–117)
BUN/Creatinine Ratio: 17 (ref 9–23)
BUN: 11 mg/dL (ref 6–24)
Bilirubin Total: 0.3 mg/dL (ref 0.0–1.2)
CO2: 22 mmol/L (ref 20–29)
Calcium: 9.2 mg/dL (ref 8.7–10.2)
Chloride: 101 mmol/L (ref 96–106)
Creatinine, Ser: 0.63 mg/dL (ref 0.57–1.00)
GFR calc Af Amer: 127 mL/min/1.73
GFR calc non Af Amer: 110 mL/min/1.73
Globulin, Total: 2.6 g/dL (ref 1.5–4.5)
Glucose: 98 mg/dL (ref 65–99)
Potassium: 4.6 mmol/L (ref 3.5–5.2)
Sodium: 138 mmol/L (ref 134–144)
Total Protein: 6.7 g/dL (ref 6.0–8.5)

## 2018-10-05 LAB — LIPID PANEL
Chol/HDL Ratio: 5.5 ratio — ABNORMAL HIGH (ref 0.0–4.4)
Cholesterol, Total: 187 mg/dL (ref 100–199)
HDL: 34 mg/dL — ABNORMAL LOW
LDL Calculated: 127 mg/dL — ABNORMAL HIGH (ref 0–99)
Triglycerides: 129 mg/dL (ref 0–149)
VLDL Cholesterol Cal: 26 mg/dL (ref 5–40)

## 2018-10-05 LAB — HEMOGLOBIN A1C
Est. average glucose Bld gHb Est-mCnc: 120 mg/dL
Hgb A1c MFr Bld: 5.8 % — ABNORMAL HIGH (ref 4.8–5.6)

## 2018-10-05 LAB — TSH: TSH: 1.68 u[IU]/mL (ref 0.450–4.500)

## 2018-10-11 ENCOUNTER — Encounter: Payer: 59 | Admitting: Family Medicine

## 2018-10-20 LAB — RESULTS CONSOLE HPV: CHL HPV: NEGATIVE

## 2018-10-20 LAB — HM PAP SMEAR

## 2018-10-21 ENCOUNTER — Other Ambulatory Visit: Payer: Self-pay | Admitting: Obstetrics and Gynecology

## 2018-10-21 DIAGNOSIS — R928 Other abnormal and inconclusive findings on diagnostic imaging of breast: Secondary | ICD-10-CM

## 2018-10-28 ENCOUNTER — Ambulatory Visit
Admission: RE | Admit: 2018-10-28 | Discharge: 2018-10-28 | Disposition: A | Discharge status: Home / Self Care | Payer: 59 | Source: Ambulatory Visit | Attending: Obstetrics and Gynecology | Admitting: Obstetrics and Gynecology

## 2018-10-28 ENCOUNTER — Other Ambulatory Visit: Payer: Self-pay | Admitting: Obstetrics and Gynecology

## 2018-10-28 ENCOUNTER — Other Ambulatory Visit: Payer: Self-pay

## 2018-10-28 DIAGNOSIS — N6489 Other specified disorders of breast: Secondary | ICD-10-CM

## 2018-10-28 DIAGNOSIS — R928 Other abnormal and inconclusive findings on diagnostic imaging of breast: Secondary | ICD-10-CM

## 2018-11-08 NOTE — Progress Notes (Signed)
Subjective

## 2018-11-08 NOTE — Progress Notes (Signed)
Subjective:    Patient ID: Sarah Fernandez, female    DOB: 08-02-1975, 43 y.o.   MRN: [id number]  HPI: 08/09/2018 OV: [name] is here to establish as a new pt.  She is a pleasant 43 year old female. PMH: Elevated BP without dx of HTN- most likely r/t to "[place] readings: SBP 120s DBP 70s Syndrome" She has lost >30 lbs in last 12 months due to improved eating and using "My Net Cal" App on her smart phone- total cals/per day is set at 1850 She estimates to drink >60 oz water/day She walks 25-30 mins/day, not currently walking to acute R ankle injury sustained last week- healing well. She denies regular rx medication She is taking ABX for scheduled root canal next week She denies tobacco/vape/ETOH use She is married She works remotely [employer]  11/09/2018 OV: [name] is here for CPE She continues to walk 1-2 miles/day She reports a diet fairly high in saturated fat/CHO She had successful root canal- was taking OTC Acetaminophen frequently in July/August for pain control  10/04/2018 Labs: CBC-stable  CMP-stable, slightly elevated Alk Phos- denies abd pain A1c-5.8, pre-diabetic  TSH-WNL, 1.680  The 10-year ASCVD risk score Mikey Bussing DC Jr., et al., 2013) is: 1.3%  Values used to calculate the score:   Age: 74 years   Sex: Female   Is Non-Hispanic African American: No   Diabetic: No   Tobacco smoker: No   Systolic Blood Pressure: 681 mmHg   Is BP treated: No   HDL Cholesterol: 34 mg/dL   Total Cholesterol: 187 mg/dL  LDL-127  She reports both mother/father has elevated cholesterol and are on statin therapy  Healthcare Maintenance: PAP-UTD- tracking down Mammogram-10/28/2018- repeat R Breast imaging 05/2019 Colonoscopy-N/A Immunizations-Tdap updated today  Patient Care Team    Relationship Specialty Notifications Start End  Mina Marble D, NP PCP - General Family Medicine  08/09/18   Ann Held, DO Consulting Physician Family  Medicine  08/09/18   Linda Hedges, [place] Physician Obstetrics and Gynecology  08/09/18   Marin Comment, [place], [place] Referring Physician Optometry  08/09/18     Patient Active Problem List   Diagnosis Date Noted  . Elevated LDL cholesterol level 11/09/2018  . Prediabetes 11/09/2018  . Healthcare maintenance 08/09/2018  . BMI 33.0-33.9,adult 08/09/2018  . Irregular menses 02/06/2012  . ELEVATED BLOOD PRESSURE WITHOUT DIAGNOSIS OF HYPERTENSION 04/05/2008     History reviewed. No pertinent past medical history.   Past Surgical History:  Procedure Laterality Date  . MYRINGOTOMY WITH TUBE PLACEMENT    . TONSILLECTOMY AND ADENOIDECTOMY       Family History  Problem Relation Age of Onset  . Diabetes Mother   . Hypertension Mother   . Hyperlipidemia Mother   . Hypertension Father   . Diabetes Father   . Cancer Father        lung  . Hyperlipidemia Father   . Heart disease Maternal Grandmother        MI  . Heart disease Maternal Grandfather 20       MI  . Heart attack Maternal Grandfather   . Diabetes Sister      Social History   Substance and Sexual Activity  Drug Use No     Social History   Substance and Sexual Activity  Alcohol Use No     Social History   Tobacco Use  Smoking Status Never Smoker  Smokeless Tobacco Never Used     Outpatient  Encounter Medications as of 11/09/2018  Medication Sig  . acetaminophen (TYLENOL) 500 MG tablet Take 500 mg by mouth every 6 (six) hours as needed.  . Cholecalciferol (VITAMIN D-3) 25 MCG (1000 UT) CAPS Take 2 capsules by mouth daily.  . famotidine (PEPCID) 20 MG tablet Take 20 mg by mouth daily.  Vladimir Faster Glycol-Propyl Glycol (SYSTANE) 0.4-0.3 % SOLN Apply 1 drop to eye daily.  . [DISCONTINUED] amoxicillin (AMOXIL) 500 MG capsule Take 500 mg by mouth 4 (four) times daily.   No facility-administered encounter medications on file as of 11/09/2018.     Allergies: Flonase [fluticasone propionate]  Body mass index is  32.65 kg/m.  Blood pressure 122/78, pulse 80, temperature 98.2 F (36.8 C), temperature source Oral, height 5' 4.5" (1.638 m), weight 193 lb 3.2 oz (87.6 kg), last menstrual period 11/08/2018, SpO2 99 %.   Review of Systems  Constitutional: Positive for fatigue. Negative for activity change, appetite change, chills, diaphoresis, fever and unexpected weight change.  HENT: Negative for congestion.   Eyes: Negative for visual disturbance.  Respiratory: Negative for cough, chest tightness, shortness of breath, wheezing and stridor.   Cardiovascular: Negative for chest pain, palpitations and leg swelling.  Gastrointestinal: Negative for abdominal distention, abdominal pain, blood in stool, constipation, diarrhea, nausea and vomiting.  Endocrine: Negative for cold intolerance, heat intolerance, polydipsia, polyphagia and polyuria.  Genitourinary: Negative for difficulty urinating and flank pain.  Musculoskeletal: Negative for arthralgias, back pain, gait problem, joint swelling, myalgias, neck pain and neck stiffness.  Skin: Negative for color change, pallor, rash and wound.  Neurological: Negative for dizziness and light-headedness.  Hematological: Negative for adenopathy. Does not bruise/bleed easily.  Psychiatric/Behavioral: Negative for agitation, behavioral problems, confusion, decreased concentration, dysphoric mood, hallucinations, self-injury, sleep disturbance and suicidal ideas. The patient is not nervous/anxious and is not hyperactive.        Objective:   Physical Exam Vitals signs and nursing note reviewed.  Constitutional:      General: She is not in acute distress.    Appearance: Normal appearance. She is obese. She is not ill-appearing, toxic-appearing or diaphoretic.  HENT:     Head: Normocephalic and atraumatic.     Right Ear: No decreased hearing noted. No tenderness. There is no impacted cerumen.     Left Ear: No decreased hearing noted. No tenderness. There is no  impacted cerumen. Tympanic membrane is scarred.     Nose: Nose normal.     Mouth/Throat:     Mouth: Mucous membranes are moist.  Eyes:     Extraocular Movements: Extraocular movements intact.     Conjunctiva/sclera: Conjunctivae normal.     Pupils: Pupils are equal, round, and reactive to light.  Neck:     Musculoskeletal: Normal range of motion and neck supple.  Cardiovascular:     Rate and Rhythm: Normal rate and regular rhythm.     Pulses: Normal pulses.     Heart sounds: Normal heart sounds. No murmur. No friction rub. No gallop.   Pulmonary:     Effort: Pulmonary effort is normal. No respiratory distress.     Breath sounds: Normal breath sounds. No stridor. No wheezing, rhonchi or rales.  Chest:     Chest wall: No tenderness.  Abdominal:     General: Abdomen is protuberant. Bowel sounds are normal. There is no distension.     Palpations: Abdomen is soft. There is no mass.     Tenderness: There is no abdominal tenderness. There is no right CVA  tenderness, left CVA tenderness, guarding or rebound. Negative signs include Murphy's sign, Rovsing's sign, McBurney's sign, psoas sign and obturator sign.     Hernia: No hernia is present.  Musculoskeletal: Normal range of motion.        General: No tenderness.  Skin:    General: Skin is warm and dry.     Capillary Refill: Capillary refill takes less than 2 seconds.  Neurological:     Mental Status: She is alert and oriented to person, place, and time.  Psychiatric:        Mood and Affect: Mood normal.        Behavior: Behavior normal.        Thought Content: Thought content normal.        Judgment: Judgment normal.           Assessment & Plan:   1. Need for Tdap vaccination   2. Need for influenza vaccination   3. Elevated hemoglobin A1c   4. Hyperlipidemia, unspecified hyperlipidemia type   5. Healthcare maintenance   6. Elevated LDL cholesterol level   7. Prediabetes     Healthcare maintenance Blood pressure is  excellent! Great job on the weight loss- continue to walk daily. To lower LDL (bad) cholesterol and blood sugar- reduce saturated fat and sugar/carbohydrate intake. Please schedule fating lab appt in 6 months Please schedule physical in one year. Continue to social distance and wear a mask when out in public.  Elevated LDL cholesterol level The 10-year ASCVD risk score Mikey Bussing DC Jr., et al., 2013) is: 1.3%   Values used to calculate the score:     Age: 33 years     Sex: Female     Is Non-Hispanic African American: No     Diabetic: No     Tobacco smoker: No     Systolic Blood Pressure: 686 mmHg     Is BP treated: No     HDL Cholesterol: 34 mg/dL     Total Cholesterol: 187 mg/dL LDL-127 Familial hypercholesteremia Encouraged lifestyle change Re-check lipids in 53month  Prediabetes Lab Results  Component Value Date   HGBA1C 5.8 (H) 10/04/2018  Encouraged lifestyle changes Will re-check A1c in 6 months    FOLLOW-UP:  Return in about 6 months (around 05/09/2019) for Fasting Labs.

## 2018-11-09 ENCOUNTER — Ambulatory Visit (INDEPENDENT_AMBULATORY_CARE_PROVIDER_SITE_OTHER): Payer: 59 | Admitting: Adult Health

## 2018-11-09 ENCOUNTER — Encounter: Payer: Self-pay | Admitting: Adult Health

## 2018-11-09 ENCOUNTER — Other Ambulatory Visit: Payer: Self-pay

## 2018-11-09 VITALS — BP 122/78 | HR 80 | Temp 98.2°F | Ht 64.5 in | Wt 193.2 lb

## 2018-11-09 DIAGNOSIS — Z23 Encounter for immunization: Secondary | ICD-10-CM

## 2018-11-09 DIAGNOSIS — Z0000 Encounter for general adult medical examination without abnormal findings: Secondary | ICD-10-CM | POA: Diagnosis not present

## 2018-11-09 DIAGNOSIS — E1169 Type 2 diabetes mellitus with other specified complication: Secondary | ICD-10-CM | POA: Insufficient documentation

## 2018-11-09 DIAGNOSIS — E119 Type 2 diabetes mellitus without complications: Secondary | ICD-10-CM | POA: Insufficient documentation

## 2018-11-09 DIAGNOSIS — R748 Abnormal levels of other serum enzymes: Secondary | ICD-10-CM

## 2018-11-09 DIAGNOSIS — E785 Hyperlipidemia, unspecified: Secondary | ICD-10-CM

## 2018-11-09 DIAGNOSIS — E782 Mixed hyperlipidemia: Secondary | ICD-10-CM | POA: Insufficient documentation

## 2018-11-09 DIAGNOSIS — R7309 Other abnormal glucose: Secondary | ICD-10-CM | POA: Diagnosis not present

## 2018-11-09 DIAGNOSIS — E7800 Pure hypercholesterolemia, unspecified: Secondary | ICD-10-CM

## 2018-11-09 DIAGNOSIS — R7303 Prediabetes: Secondary | ICD-10-CM

## 2018-11-09 NOTE — Assessment & Plan Note (Signed)
Lab Results  Component Value Date   HGBA1C 5.8 (H) 10/04/2018  Encouraged lifestyle changes Will re-check A1c in 6 months

## 2018-11-09 NOTE — Assessment & Plan Note (Signed)
The 10-year ASCVD risk score Sarah Fernandez DC [name]., et al., 2013) is: 1.3%   Values used to calculate the score:     Age: 43 years     Sex: Female     Is Non-Hispanic African American: No     Diabetic: No     Tobacco smoker: No     Systolic Blood Pressure: 116 mmHg     Is BP treated: No     HDL Cholesterol: 34 mg/dL     Total Cholesterol: 187 mg/dL LDL-127 Familial hypercholesteremia Encouraged lifestyle change Re-check lipids in 70months

## 2018-11-09 NOTE — Assessment & Plan Note (Signed)
Blood pressure is excellent! Great job on the weight loss- continue to walk daily. To lower LDL (bad) cholesterol and blood sugar- reduce saturated fat and sugar/carbohydrate intake. Please schedule fating lab appt in 6 months Please schedule physical in one year. Continue to social distance and wear a mask when out in public.

## 2018-11-09 NOTE — Patient Instructions (Addendum)
Preventive Care for Adults, Female  A healthy lifestyle and preventive care can promote health and wellness. Preventive health guidelines for women include the following key practices.   A routine yearly physical is a good way to check with your health care provider about your health and preventive screening. It is a chance to share any concerns and updates on your health and to receive a thorough exam.   Visit your dentist for a routine exam and preventive care every 6 months. Brush your teeth twice a day and floss once a day. Good oral hygiene prevents tooth decay and gum disease.   The frequency of eye exams is based on your age, health, family medical history, use of contact lenses, and other factors. Follow your health care provider's recommendations for frequency of eye exams.   Eat a healthy diet. Foods like vegetables, fruits, whole grains, low-fat dairy products, and lean protein foods contain the nutrients you need without too many calories. Decrease your intake of foods high in solid fats, added sugars, and salt. Eat the right amount of calories for you.Get information about a proper diet from your health care provider, if necessary.   Regular physical exercise is one of the most important things you can do for your health. Most adults should get at least 150 minutes of moderate-intensity exercise (any activity that increases your heart rate and causes you to sweat) each week. In addition, most adults need muscle-strengthening exercises on 2 or more days a week.   Maintain a healthy weight. The body mass index (BMI) is a screening tool to identify possible weight problems. It provides an estimate of body fat based on height and weight. Your health care provider can find your BMI, and can help you achieve or maintain a healthy weight.For adults 20 years and older:   - A BMI below 18.5 is considered underweight.   - A BMI of 18.5 to 24.9 is normal.   - A BMI of 25 to 29.9 is  considered overweight.   - A BMI of 30 and above is considered obese.   Maintain normal blood lipids and cholesterol levels by exercising and minimizing your intake of trans and saturated fats.  Eat a balanced diet with plenty of fruit and vegetables. Blood tests for lipids and cholesterol should begin at age 20 and be repeated every 5 years minimum.  If your lipid or cholesterol levels are high, you are over 40, or you are at high risk for heart disease, you may need your cholesterol levels checked more frequently.Ongoing high lipid and cholesterol levels should be treated with medicines if diet and exercise are not working.   If you smoke, find out from your health care provider how to quit. If you do not use tobacco, do not start.   Lung cancer screening is recommended for adults aged 55-80 years who are at high risk for developing lung cancer because of a history of smoking. A yearly low-dose CT scan of the lungs is recommended for people who have at least a 30-pack-year history of smoking and are a current smoker or have quit within the past 15 years. A pack year of smoking is smoking an average of 1 pack of cigarettes a day for 1 year (for example: 1 pack a day for 30 years or 2 packs a day for 15 years). Yearly screening should continue until the smoker has stopped smoking for at least 15 years. Yearly screening should be stopped for people who develop a   health problem that would prevent them from having lung cancer treatment.   If you are pregnant, do not drink alcohol. If you are breastfeeding, be very cautious about drinking alcohol. If you are not pregnant and choose to drink alcohol, do not have more than 1 drink per day. One drink is considered to be 12 ounces (355 mL) of beer, 5 ounces (148 mL) of wine, or 1.5 ounces (44 mL) of liquor.   Avoid use of street drugs. Do not share needles with anyone. Ask for help if you need support or instructions about stopping the use of  drugs.   High blood pressure causes heart disease and increases the risk of stroke. Your blood pressure should be checked at least yearly.  Ongoing high blood pressure should be treated with medicines if weight loss and exercise do not work.   If you are 69-55 years old, ask your health care provider if you should take aspirin to prevent strokes.   Diabetes screening involves taking a blood sample to check your fasting blood sugar level. This should be done once every 3 years, after age 38, if you are within normal weight and without risk factors for diabetes. Testing should be considered at a younger age or be carried out more frequently if you are overweight and have at least 1 risk factor for diabetes.   Breast cancer screening is essential preventive care for women. You should practice "breast self-awareness."  This means understanding the normal appearance and feel of your breasts and may include breast self-examination.  Any changes detected, no matter how small, should be reported to a health care provider.  Women in their 80s and 30s should have a clinical breast exam (CBE) by a health care provider as part of a regular health exam every 1 to 3 years.  After age 66, women should have a CBE every year.  Starting at age 1, women should consider having a mammogram (breast X-ray test) every year.  Women who have a family history of breast cancer should talk to their health care provider about genetic screening.  Women at a high risk of breast cancer should talk to their health care providers about having an MRI and a mammogram every year.   -Breast cancer gene (BRCA)-related cancer risk assessment is recommended for women who have family members with BRCA-related cancers. BRCA-related cancers include breast, ovarian, tubal, and peritoneal cancers. Having family members with these cancers may be associated with an increased risk for harmful changes (mutations) in the breast cancer genes BRCA1 and  BRCA2. Results of the assessment will determine the need for genetic counseling and BRCA1 and BRCA2 testing.   The Pap test is a screening test for cervical cancer. A Pap test can show cell changes on the cervix that might become cervical cancer if left untreated. A Pap test is a procedure in which cells are obtained and examined from the lower end of the uterus (cervix).   - Women should have a Pap test starting at age 57.   - Between ages 90 and 70, Pap tests should be repeated every 2 years.   - Beginning at age 63, you should have a Pap test every 3 years as long as the past 3 Pap tests have been normal.   - Some women have medical problems that increase the chance of getting cervical cancer. Talk to your health care provider about these problems. It is especially important to talk to your health care provider if a  new problem develops soon after your last Pap test. In these cases, your health care provider may recommend more frequent screening and Pap tests.   - The above recommendations are the same for women who have or have not gotten the vaccine for human papillomavirus (HPV).   - If you had a hysterectomy for a problem that was not cancer or a condition that could lead to cancer, then you no longer need Pap tests. Even if you no longer need a Pap test, a regular exam is a good idea to make sure no other problems are starting.   - If you are between ages 36 and 66 years, and you have had normal Pap tests going back 10 years, you no longer need Pap tests. Even if you no longer need a Pap test, a regular exam is a good idea to make sure no other problems are starting.   - If you have had past treatment for cervical cancer or a condition that could lead to cancer, you need Pap tests and screening for cancer for at least 20 years after your treatment.   - If Pap tests have been discontinued, risk factors (such as a new sexual partner) need to be reassessed to determine if screening should  be resumed.   - The HPV test is an additional test that may be used for cervical cancer screening. The HPV test looks for the virus that can cause the cell changes on the cervix. The cells collected during the Pap test can be tested for HPV. The HPV test could be used to screen women aged 70 years and older, and should be used in women of any age who have unclear Pap test results. After the age of 67, women should have HPV testing at the same frequency as a Pap test.   Colorectal cancer can be detected and often prevented. Most routine colorectal cancer screening begins at the age of 57 years and continues through age 26 years. However, your health care provider may recommend screening at an earlier age if you have risk factors for colon cancer. On a yearly basis, your health care provider may provide home test kits to check for hidden blood in the stool.  Use of a small camera at the end of a tube, to directly examine the colon (sigmoidoscopy or colonoscopy), can detect the earliest forms of colorectal cancer. Talk to your health care provider about this at age 23, when routine screening begins. Direct exam of the colon should be repeated every 5 -10 years through age 49 years, unless early forms of pre-cancerous polyps or small growths are found.   People who are at an increased risk for hepatitis B should be screened for this virus. You are considered at high risk for hepatitis B if:  -You were born in a country where hepatitis B occurs often. Talk with your health care provider about which countries are considered high risk.  - Your parents were born in a high-risk country and you have not received a shot to protect against hepatitis B (hepatitis B vaccine).  - You have HIV or AIDS.  - You use needles to inject street drugs.  - You live with, or have sex with, someone who has Hepatitis B.  - You get hemodialysis treatment.  - You take certain medicines for conditions like cancer, organ  transplantation, and autoimmune conditions.   Hepatitis C blood testing is recommended for all people born from [birth year] through 1965 and any individual  with known risks for hepatitis C.   Practice safe sex. Use condoms and avoid high-risk sexual practices to reduce the spread of sexually transmitted infections (STIs). STIs include gonorrhea, chlamydia, syphilis, trichomonas, herpes, HPV, and human immunodeficiency virus (HIV). Herpes, HIV, and HPV are viral illnesses that have no cure. They can result in disability, cancer, and death. Sexually active women aged 25 years and younger should be checked for chlamydia. Older women with new or multiple partners should also be tested for chlamydia. Testing for other STIs is recommended if you are sexually active and at increased risk.   Osteoporosis is a disease in which the bones lose minerals and strength with aging. This can result in serious bone fractures or breaks. The risk of osteoporosis can be identified using a bone density scan. Women ages 65 years and over and women at risk for fractures or osteoporosis should discuss screening with their health care providers. Ask your health care provider whether you should take a calcium supplement or vitamin D to There are also several preventive steps women can take to avoid osteoporosis and resulting fractures or to keep osteoporosis from worsening. -->Recommendations include:  Eat a balanced diet high in fruits, vegetables, calcium, and vitamins.  Get enough calcium. The recommended total intake of is 1,200 mg daily; for best absorption, if taking supplements, divide doses into 250-500 mg doses throughout the day. Of the two types of calcium, calcium carbonate is best absorbed when taken with food but calcium citrate can be taken on an empty stomach.  Get enough vitamin D. NAMS and the National Osteoporosis Foundation recommend at least 1,000 IU per day for women age 50 and over who are at risk of vitamin D  deficiency. Vitamin D deficiency can be caused by inadequate sun exposure (for example, those who live in northern latitudes).  Avoid alcohol and smoking. Heavy alcohol intake (more than 7 drinks per week) increases the risk of falls and hip fracture and women smokers tend to lose bone more rapidly and have lower bone mass than nonsmokers. Stopping smoking is one of the most important changes women can make to improve their health and decrease risk for disease.  Be physically active every day. Weight-bearing exercise (for example, fast walking, hiking, jogging, and weight training) may strengthen bones or slow the rate of bone loss that comes with aging. Balancing and muscle-strengthening exercises can reduce the risk of falling and fracture.  Consider therapeutic medications. Currently, several types of effective drugs are available. Healthcare providers can recommend the type most appropriate for each woman.  Eliminate environmental factors that may contribute to accidents. Falls cause nearly 90% of all osteoporotic fractures, so reducing this risk is an important bone-health strategy. Measures include ample lighting, removing obstructions to walking, using nonskid rugs on floors, and placing mats and/or grab bars in showers.  Be aware of medication side effects. Some common medicines make bones weaker. These include a type of steroid drug called glucocorticoids used for arthritis and asthma, some antiseizure drugs, certain sleeping pills, treatments for endometriosis, and some cancer drugs. An overactive thyroid gland or using too much thyroid hormone for an underactive thyroid can also be a problem. If you are taking these medicines, talk to your doctor about what you can do to help protect your bones.reduce the rate of osteoporosis.    Menopause can be associated with physical symptoms and risks. Hormone replacement therapy is available to decrease symptoms and risks. You should talk to your  health care provider   about whether hormone replacement therapy is right for you.   Use sunscreen. Apply sunscreen liberally and repeatedly throughout the day. You should seek shade when your shadow is shorter than you. Protect yourself by wearing long sleeves, pants, a wide-brimmed hat, and sunglasses year round, whenever you are outdoors.   Once a month, do a whole body skin exam, using a mirror to look at the skin on your back. Tell your health care provider of new moles, moles that have irregular borders, moles that are larger than a pencil eraser, or moles that have changed in shape or color.   -Stay current with required vaccines (immunizations).   Influenza vaccine. All adults should be immunized every year.  Tetanus, diphtheria, and acellular pertussis (Td, Tdap) vaccine. Pregnant women should receive 1 dose of Tdap vaccine during each pregnancy. The dose should be obtained regardless of the length of time since the last dose. Immunization is preferred during the 27th 36th week of gestation. An adult who has not previously received Tdap or who does not know her vaccine status should receive 1 dose of Tdap. This initial dose should be followed by tetanus and diphtheria toxoids (Td) booster doses every 10 years. Adults with an unknown or incomplete history of completing a 3-dose immunization series with Td-containing vaccines should begin or complete a primary immunization series including a Tdap dose. Adults should receive a Td booster every 10 years.  Varicella vaccine. An adult without evidence of immunity to varicella should receive 2 doses or a second dose if she has previously received 1 dose. Pregnant females who do not have evidence of immunity should receive the first dose after pregnancy. This first dose should be obtained before leaving the health care facility. The second dose should be obtained 4 8 weeks after the first dose.  Human papillomavirus (HPV) vaccine. Females aged 13 26  years who have not received the vaccine previously should obtain the 3-dose series. The vaccine is not recommended for use in pregnant females. However, pregnancy testing is not needed before receiving a dose. If a female is found to be pregnant after receiving a dose, no treatment is needed. In that case, the remaining doses should be delayed until after the pregnancy. Immunization is recommended for any person with an immunocompromised condition through the age of 26 years if she did not get any or all doses earlier. During the 3-dose series, the second dose should be obtained 4 8 weeks after the first dose. The third dose should be obtained 24 weeks after the first dose and 16 weeks after the second dose.  Zoster vaccine. One dose is recommended for adults aged 60 years or older unless certain conditions are present.  Measles, mumps, and rubella (MMR) vaccine. Adults born before 1957 generally are considered immune to measles and mumps. Adults born in 1957 or later should have 1 or more doses of MMR vaccine unless there is a contraindication to the vaccine or there is laboratory evidence of immunity to each of the three diseases. A routine second dose of MMR vaccine should be obtained at least 28 days after the first dose for students attending postsecondary schools, health care workers, or international travelers. People who received inactivated measles vaccine or an unknown type of measles vaccine during 1963 1967 should receive 2 doses of MMR vaccine. People who received inactivated mumps vaccine or an unknown type of mumps vaccine before 1979 and are at high risk for mumps infection should consider immunization with 2 doses of   MMR vaccine. For females of childbearing age, rubella immunity should be determined. If there is no evidence of immunity, females who are not pregnant should be vaccinated. If there is no evidence of immunity, females who are pregnant should delay immunization until after pregnancy.  Unvaccinated health care workers born before [birth year] who lack laboratory evidence of measles, mumps, or rubella immunity or laboratory confirmation of disease should consider measles and mumps immunization with 2 doses of MMR vaccine or rubella immunization with 1 dose of MMR vaccine.  Pneumococcal 13-valent conjugate (PCV13) vaccine. When indicated, a person who is uncertain of her immunization history and has no record of immunization should receive the PCV13 vaccine. An adult aged 54 years or older who has certain medical conditions and has not been previously immunized should receive 1 dose of PCV13 vaccine. This PCV13 should be followed with a dose of pneumococcal polysaccharide (PPSV23) vaccine. The PPSV23 vaccine dose should be obtained at least 8 weeks after the dose of PCV13 vaccine. An adult aged 58 years or older who has certain medical conditions and previously received 1 or more doses of PPSV23 vaccine should receive 1 dose of PCV13. The PCV13 vaccine dose should be obtained 1 or more years after the last PPSV23 vaccine dose.  Pneumococcal polysaccharide (PPSV23) vaccine. When PCV13 is also indicated, PCV13 should be obtained first. All adults aged 58 years and older should be immunized. An adult younger than age 65 years who has certain medical conditions should be immunized. Any person who resides in a nursing home or long-term care facility should be immunized. An adult smoker should be immunized. People with an immunocompromised condition and certain other conditions should receive both PCV13 and PPSV23 vaccines. People with human immunodeficiency virus (HIV) infection should be immunized as soon as possible after diagnosis. Immunization during chemotherapy or radiation therapy should be avoided. Routine use of PPSV23 vaccine is not recommended for American Indians, [place] Natives, or people younger than 65 years unless there are medical conditions that require PPSV23 vaccine. When indicated,  people who have unknown immunization and have no record of immunization should receive PPSV23 vaccine. One-time revaccination 5 years after the first dose of PPSV23 is recommended for people aged 70 64 years who have chronic kidney failure, nephrotic syndrome, asplenia, or immunocompromised conditions. People who received 1 2 doses of PPSV23 before age 32 years should receive another dose of PPSV23 vaccine at age 96 years or later if at least 5 years have passed since the previous dose. Doses of PPSV23 are not needed for people immunized with PPSV23 at or after age 55 years.  Meningococcal vaccine. Adults with asplenia or persistent complement component deficiencies should receive 2 doses of quadrivalent meningococcal conjugate (MenACWY-D) vaccine. The doses should be obtained at least 2 months apart. Microbiologists working with certain meningococcal bacteria, [employer] recruits, people at risk during an outbreak, and people who travel to or live in countries with a high rate of meningitis should be immunized. A first-year college student up through age 58 years who is living in a residence hall should receive a dose if she did not receive a dose on or after her 16th birthday. Adults who have certain high-risk conditions should receive one or more doses of vaccine.  Hepatitis A vaccine. Adults who wish to be protected from this disease, have certain high-risk conditions, work with hepatitis A-infected animals, work in hepatitis A research labs, or travel to or work in countries with a high rate of hepatitis A should be  immunized. Adults who were previously unvaccinated and who anticipate close contact with an international adoptee during the first 60 days after arrival in the [place] from a country with a high rate of hepatitis A should be immunized.  Hepatitis B vaccine.  Adults who wish to be protected from this disease, have certain high-risk conditions, may be exposed to blood or other infectious  body fluids, are household contacts or sex partners of hepatitis B positive people, are clients or workers in certain care facilities, or travel to or work in countries with a high rate of hepatitis B should be immunized.  Haemophilus influenzae type b (Hib) vaccine. A previously unvaccinated person with asplenia or sickle cell disease or having a scheduled splenectomy should receive 1 dose of Hib vaccine. Regardless of previous immunization, a recipient of a hematopoietic stem cell transplant should receive a 3-dose series 6 12 months after her successful transplant. Hib vaccine is not recommended for adults with HIV infection.  Preventive Services / Frequency Ages 6 to 39years  Blood pressure check.** / Every 1 to 2 years.  Lipid and cholesterol check.** / Every 5 years beginning at age 39.  Clinical breast exam.** / Every 3 years for women in their 61s and 62s.  BRCA-related cancer risk assessment.** / For women who have family members with a BRCA-related cancer (breast, ovarian, tubal, or peritoneal cancers).  Pap test.** / Every 2 years from ages 47 through 85. Every 3 years starting at age 34 through age 12 or 74 with a history of 3 consecutive normal Pap tests.  HPV screening.** / Every 3 years from ages 46 through ages 43 to 54 with a history of 3 consecutive normal Pap tests.  Hepatitis C blood test.** / For any individual with known risks for hepatitis C.  Skin self-exam. / Monthly.  Influenza vaccine. / Every year.  Tetanus, diphtheria, and acellular pertussis (Tdap, Td) vaccine.** / Consult your health care provider. Pregnant women should receive 1 dose of Tdap vaccine during each pregnancy. 1 dose of Td every 10 years.  Varicella vaccine.** / Consult your health care provider. Pregnant females who do not have evidence of immunity should receive the first dose after pregnancy.  HPV vaccine. / 3 doses over 6 months, if 64 and younger. The vaccine is not recommended for use in  pregnant females. However, pregnancy testing is not needed before receiving a dose.  Measles, mumps, rubella (MMR) vaccine.** / You need at least 1 dose of MMR if you were born in 1957 or later. You may also need a 2nd dose. For females of childbearing age, rubella immunity should be determined. If there is no evidence of immunity, females who are not pregnant should be vaccinated. If there is no evidence of immunity, females who are pregnant should delay immunization until after pregnancy.  Pneumococcal 13-valent conjugate (PCV13) vaccine.** / Consult your health care provider.  Pneumococcal polysaccharide (PPSV23) vaccine.** / 1 to 2 doses if you smoke cigarettes or if you have certain conditions.  Meningococcal vaccine.** / 1 dose if you are age 71 to 37 years and a Market researcher living in a residence hall, or have one of several medical conditions, you need to get vaccinated against meningococcal disease. You may also need additional booster doses.  Hepatitis A vaccine.** / Consult your health care provider.  Hepatitis B vaccine.** / Consult your health care provider.  Haemophilus influenzae type b (Hib) vaccine.** / Consult your health care provider.  Ages 55 to 64years  Blood pressure check.** / Every 1 to 2 years.  Lipid and cholesterol check.** / Every 5 years beginning at age 20 years.  Lung cancer screening. / Every year if you are aged 55 80 years and have a 30-pack-year history of smoking and currently smoke or have quit within the past 15 years. Yearly screening is stopped once you have quit smoking for at least 15 years or develop a health problem that would prevent you from having lung cancer treatment.  Clinical breast exam.** / Every year after age 40 years.  BRCA-related cancer risk assessment.** / For women who have family members with a BRCA-related cancer (breast, ovarian, tubal, or peritoneal cancers).  Mammogram.** / Every year beginning at age 40  years and continuing for as long as you are in good health. Consult with your health care provider.  Pap test.** / Every 3 years starting at age 30 years through age 65 or 70 years with a history of 3 consecutive normal Pap tests.  HPV screening.** / Every 3 years from ages 30 years through ages 65 to 70 years with a history of 3 consecutive normal Pap tests.  Fecal occult blood test (FOBT) of stool. / Every year beginning at age 50 years and continuing until age 75 years. You may not need to do this test if you get a colonoscopy every 10 years.  Flexible sigmoidoscopy or colonoscopy.** / Every 5 years for a flexible sigmoidoscopy or every 10 years for a colonoscopy beginning at age 50 years and continuing until age 75 years.  Hepatitis C blood test.** / For all people born from 1945 through 1965 and any individual with known risks for hepatitis C.  Skin self-exam. / Monthly.  Influenza vaccine. / Every year.  Tetanus, diphtheria, and acellular pertussis (Tdap/Td) vaccine.** / Consult your health care provider. Pregnant women should receive 1 dose of Tdap vaccine during each pregnancy. 1 dose of Td every 10 years.  Varicella vaccine.** / Consult your health care provider. Pregnant females who do not have evidence of immunity should receive the first dose after pregnancy.  Zoster vaccine.** / 1 dose for adults aged 60 years or older.  Measles, mumps, rubella (MMR) vaccine.** / You need at least 1 dose of MMR if you were born in 1957 or later. You may also need a 2nd dose. For females of childbearing age, rubella immunity should be determined. If there is no evidence of immunity, females who are not pregnant should be vaccinated. If there is no evidence of immunity, females who are pregnant should delay immunization until after pregnancy.  Pneumococcal 13-valent conjugate (PCV13) vaccine.** / Consult your health care provider.  Pneumococcal polysaccharide (PPSV23) vaccine.** / 1 to 2 doses if  you smoke cigarettes or if you have certain conditions.  Meningococcal vaccine.** / Consult your health care provider.  Hepatitis A vaccine.** / Consult your health care provider.  Hepatitis B vaccine.** / Consult your health care provider.  Haemophilus influenzae type b (Hib) vaccine.** / Consult your health care provider.  Ages 65 years and over  Blood pressure check.** / Every 1 to 2 years.  Lipid and cholesterol check.** / Every 5 years beginning at age 20 years.  Lung cancer screening. / Every year if you are aged 55 80 years and have a 30-pack-year history of smoking and currently smoke or have quit within the past 15 years. Yearly screening is stopped once you have quit smoking for at least 15 years or develop a health problem that   would prevent you from having lung cancer treatment.  Clinical breast exam.** / Every year after age 103 years.  BRCA-related cancer risk assessment.** / For women who have family members with a BRCA-related cancer (breast, ovarian, tubal, or peritoneal cancers).  Mammogram.** / Every year beginning at age 36 years and continuing for as long as you are in good health. Consult with your health care provider.  Pap test.** / Every 3 years starting at age 5 years through age 85 or 10 years with 3 consecutive normal Pap tests. Testing can be stopped between 65 and 70 years with 3 consecutive normal Pap tests and no abnormal Pap or HPV tests in the past 10 years.  HPV screening.** / Every 3 years from ages 93 years through ages 70 or 45 years with a history of 3 consecutive normal Pap tests. Testing can be stopped between 65 and 70 years with 3 consecutive normal Pap tests and no abnormal Pap or HPV tests in the past 10 years.  Fecal occult blood test (FOBT) of stool. / Every year beginning at age 8 years and continuing until age 45 years. You may not need to do this test if you get a colonoscopy every 10 years.  Flexible sigmoidoscopy or colonoscopy.** /  Every 5 years for a flexible sigmoidoscopy or every 10 years for a colonoscopy beginning at age 69 years and continuing until age 68 years.  Hepatitis C blood test.** / For all people born from [birth year] through 1965 and any individual with known risks for hepatitis C.  Osteoporosis screening.** / A one-time screening for women ages 7 years and over and women at risk for fractures or osteoporosis.  Skin self-exam. / Monthly.  Influenza vaccine. / Every year.  Tetanus, diphtheria, and acellular pertussis (Tdap/Td) vaccine.** / 1 dose of Td every 10 years.  Varicella vaccine.** / Consult your health care provider.  Zoster vaccine.** / 1 dose for adults aged 5 years or older.  Pneumococcal 13-valent conjugate (PCV13) vaccine.** / Consult your health care provider.  Pneumococcal polysaccharide (PPSV23) vaccine.** / 1 dose for all adults aged 74 years and older.  Meningococcal vaccine.** / Consult your health care provider.  Hepatitis A vaccine.** / Consult your health care provider.  Hepatitis B vaccine.** / Consult your health care provider.  Haemophilus influenzae type b (Hib) vaccine.** / Consult your health care provider. ** Family history and personal history of risk and conditions may change your health care provider's recommendations. Document Released: 04/15/2001 Document Revised: 12/08/2012  [facility] Patient Information 2014 [place], [place].   EXERCISE AND DIET:  We recommended that you start or continue a regular exercise program for good health. Regular exercise means any activity that makes your heart beat faster and makes you sweat.  We recommend exercising at least 30 minutes per day at least 3 days a week, preferably 5.  We also recommend a diet low in fat and sugar / carbohydrates.  Inactivity, poor dietary choices and obesity can cause diabetes, heart attack, stroke, and kidney damage, among others.     ALCOHOL AND SMOKING:  Women should limit their alcohol intake to no  more than 7 drinks/beers/glasses of wine (combined, not each!) per week. Moderation of alcohol intake to this level decreases your risk of breast cancer and liver damage.  ( And of course, no recreational drugs are part of a healthy lifestyle.)  Also, you should not be smoking at all or even being exposed to second hand smoke. Most people know smoking can  cause cancer, and various heart and lung diseases, but did you know it also contributes to weakening of your bones?  Aging of your skin?  Yellowing of your teeth and nails?   CALCIUM AND VITAMIN D:  Adequate intake of calcium and Vitamin D are recommended.  The recommendations for exact amounts of these supplements seem to change often, but generally speaking 600 mg of calcium (either carbonate or citrate) and 800 units of Vitamin D per day seems prudent. Certain women may benefit from higher intake of Vitamin D.  If you are among these women, your doctor will have told you during your visit.     PAP SMEARS:  Pap smears, to check for cervical cancer or precancers,  have traditionally been done yearly, although recent scientific advances have shown that most women can have pap smears less often.  However, every woman still should have a physical exam from her gynecologist or primary care physician every year. It will include a breast check, inspection of the vulva and vagina to check for abnormal growths or skin changes, a visual exam of the cervix, and then an exam to evaluate the size and shape of the uterus and ovaries.  And after 43 years of age, a rectal exam is indicated to check for rectal cancers. We will also provide age appropriate advice regarding health maintenance, like when you should have certain vaccines, screening for sexually transmitted diseases, bone density testing, colonoscopy, mammograms, etc.    MAMMOGRAMS:  All women over 105 years old should have a yearly mammogram. Many facilities now offer a "3D" mammogram, which may cost  around $50 extra out of pocket. If possible,  we recommend you accept the option to have the 3D mammogram performed.  It both reduces the number of women who will be called back for extra views which then turn out to be normal, and it is better than the routine mammogram at detecting truly abnormal areas.     COLONOSCOPY:  Colonoscopy to screen for colon cancer is recommended for all women at age 72.  We know, you hate the idea of the prep.  We agree, BUT, having colon cancer and not knowing it is worse!!  Colon cancer so often starts as a polyp that can be seen and removed at colonscopy, which can quite literally save your life!  And if your first colonoscopy is normal and you have no family history of colon cancer, most women don't have to have it again for 10 years.  Once every ten years, you can do something that may end up saving your life, right?  We will be happy to help you get it scheduled when you are ready.  Be sure to check your insurance coverage so you understand how much it will cost.  It may be covered as a preventative service at no cost, but you should check your particular policy.   Blood pressure is excellent! Great job on the weight loss- continue to walk daily. To lower LDL (bad) cholesterol and blood sugar- reduce saturated fat and sugar/carbohydrate intake. Please schedule fating lab appt in 6 months Please schedule physical in one year. Continue to social distance and wear a mask when out in public. GREAT TO SEE YOU!

## 2018-12-02 ENCOUNTER — Encounter: Payer: Self-pay | Admitting: Adult Health

## 2019-05-04 ENCOUNTER — Other Ambulatory Visit: Payer: 59

## 2019-05-10 ENCOUNTER — Other Ambulatory Visit: Payer: 59

## 2019-05-14 ENCOUNTER — Ambulatory Visit: Payer: 59 | Attending: Internal Medicine

## 2019-05-14 ENCOUNTER — Other Ambulatory Visit: Payer: Self-pay

## 2019-05-14 DIAGNOSIS — Z23 Encounter for immunization: Secondary | ICD-10-CM

## 2019-05-14 NOTE — Progress Notes (Signed)
   Covid-19 Vaccination Clinic  Name:  Sarah Fernandez    MRN: [id number] DOB: [birth year]  05/14/2019  [name] was observed post Covid-19 immunization for 15 minutes without incident. She was provided with Vaccine Information Sheet and instruction to access the V-Safe system.   [name] was instructed to call 911 with any severe reactions post vaccine: Marland Kitchen Difficulty breathing  . Swelling of face and throat  . A fast heartbeat  . A bad rash all over body  . Dizziness and weakness   Immunizations Administered    Name Date Dose VIS Date Route   Pfizer COVID-19 Vaccine 05/14/2019 10:54 AM 0.3 mL 02/11/2019 Intramuscular   Manufacturer: [employer], [employer]   Lot: [id number]   NDC: [address]-5

## 2019-06-07 ENCOUNTER — Other Ambulatory Visit: Payer: Self-pay

## 2019-06-07 ENCOUNTER — Ambulatory Visit: Payer: 59 | Attending: Internal Medicine

## 2019-06-07 DIAGNOSIS — Z23 Encounter for immunization: Secondary | ICD-10-CM

## 2019-06-07 NOTE — Progress Notes (Signed)
   Covid-19 Vaccination Clinic  Name:  Sarah Fernandez    MRN: [id number] DOB: 07-Aug-1975  06/07/2019  [name] was observed post Covid-19 immunization for 15 minutes without incident. She was provided with Vaccine Information Sheet and instruction to access the V-Safe system.   [name] was instructed to call 911 with any severe reactions post vaccine: Marland Kitchen Difficulty breathing  . Swelling of face and throat  . A fast heartbeat  . A bad rash all over body  . Dizziness and weakness   Immunizations Administered    Name Date Dose VIS Date Route   Pfizer COVID-19 Vaccine 06/07/2019  2:28 PM 0.3 mL 02/11/2019 Intramuscular   Manufacturer: [employer], [employer]   Lot: [id number]   NDC: [address]-5

## 2019-06-24 ENCOUNTER — Other Ambulatory Visit: Payer: Self-pay

## 2019-06-24 ENCOUNTER — Other Ambulatory Visit (INDEPENDENT_AMBULATORY_CARE_PROVIDER_SITE_OTHER): Payer: 59

## 2019-06-24 DIAGNOSIS — R7309 Other abnormal glucose: Secondary | ICD-10-CM

## 2019-06-24 DIAGNOSIS — R748 Abnormal levels of other serum enzymes: Secondary | ICD-10-CM

## 2019-06-24 DIAGNOSIS — N926 Irregular menstruation, unspecified: Secondary | ICD-10-CM | POA: Diagnosis not present

## 2019-06-24 DIAGNOSIS — E785 Hyperlipidemia, unspecified: Secondary | ICD-10-CM

## 2019-06-24 LAB — POCT URINE PREGNANCY: Preg Test, Ur: NEGATIVE

## 2019-06-24 NOTE — Progress Notes (Signed)
Pt informed of urine pregnancy test results.  Tiajuana Amass, CMA

## 2019-06-24 NOTE — Progress Notes (Signed)
Patient requesting urine preg due to irregular menstrual cycle. AS, CMA

## 2019-06-24 NOTE — Addendum Note (Signed)
Addended by: Sylvester Harder on: 06/24/2019 08:58 AM   Modules accepted: Orders

## 2019-06-25 LAB — HEPATIC FUNCTION PANEL
ALT: 22 IU/L (ref 0–32)
AST: 18 IU/L (ref 0–40)
Albumin: 4.1 g/dL (ref 3.8–4.8)
Alkaline Phosphatase: 104 IU/L (ref 39–117)
Bilirubin Total: 0.3 mg/dL (ref 0.0–1.2)
Bilirubin, Direct: 0.11 mg/dL (ref 0.00–0.40)
Total Protein: 6.6 g/dL (ref 6.0–8.5)

## 2019-06-25 LAB — LIPID PANEL
Chol/HDL Ratio: 4.6 ratio — ABNORMAL HIGH (ref 0.0–4.4)
Cholesterol, Total: 189 mg/dL (ref 100–199)
HDL: 41 mg/dL (ref >39)
LDL Chol Calc (NIH): 123 mg/dL — ABNORMAL HIGH (ref 0–99)
Triglycerides: 140 mg/dL (ref 0–149)
VLDL Cholesterol Cal: 25 mg/dL (ref 5–40)

## 2019-06-25 LAB — HEMOGLOBIN A1C
Est. average glucose Bld gHb Est-mCnc: 120 mg/dL
Hgb A1c MFr Bld: 5.8 % — ABNORMAL HIGH (ref 4.8–5.6)

## 2019-10-21 ENCOUNTER — Other Ambulatory Visit: Payer: Self-pay

## 2019-10-21 ENCOUNTER — Other Ambulatory Visit: Payer: No Typology Code available for payment source

## 2019-10-21 DIAGNOSIS — Z20822 Contact with and (suspected) exposure to covid-19: Secondary | ICD-10-CM

## 2019-10-22 LAB — NOVEL CORONAVIRUS, NAA: SARS-CoV-2, NAA: NOT DETECTED

## 2019-10-22 LAB — SPECIMEN STATUS REPORT

## 2019-10-22 LAB — SARS-COV-2, NAA 2 DAY TAT

## 2019-11-11 ENCOUNTER — Other Ambulatory Visit: Payer: Self-pay

## 2019-11-11 ENCOUNTER — Ambulatory Visit (INDEPENDENT_AMBULATORY_CARE_PROVIDER_SITE_OTHER): Payer: No Typology Code available for payment source | Admitting: Physician Assistant

## 2019-11-11 DIAGNOSIS — R300 Dysuria: Secondary | ICD-10-CM

## 2019-11-13 LAB — URINE CULTURE

## 2019-11-25 NOTE — Progress Notes (Signed)
Patient complaining of dysuria. Will send for urine culture and pending results, will start antibiotic treatment if indicated.  Mayer Masker, PA-C

## 2020-02-09 ENCOUNTER — Other Ambulatory Visit: Payer: Self-pay | Admitting: Physician Assistant

## 2020-02-09 ENCOUNTER — Other Ambulatory Visit: Payer: Self-pay | Admitting: Obstetrics and Gynecology

## 2020-02-09 DIAGNOSIS — N6489 Other specified disorders of breast: Secondary | ICD-10-CM

## 2020-03-21 ENCOUNTER — Encounter: Payer: No Typology Code available for payment source | Admitting: Physician Assistant

## 2020-03-27 ENCOUNTER — Other Ambulatory Visit: Payer: No Typology Code available for payment source

## 2020-04-24 ENCOUNTER — Other Ambulatory Visit: Payer: No Typology Code available for payment source

## 2020-05-11 ENCOUNTER — Ambulatory Visit
Admission: RE | Admit: 2020-05-11 | Discharge: 2020-05-11 | Disposition: A | Discharge status: Home / Self Care | Payer: No Typology Code available for payment source | Source: Ambulatory Visit | Attending: Physician Assistant | Admitting: Physician Assistant

## 2020-05-11 ENCOUNTER — Other Ambulatory Visit: Payer: Self-pay

## 2020-05-11 DIAGNOSIS — N6489 Other specified disorders of breast: Secondary | ICD-10-CM | POA: Insufficient documentation

## 2020-05-24 IMAGING — MG DIGITAL DIAGNOSTIC UNILATERAL RIGHT MAMMOGRAM WITH TOMO AND CAD
4 series · 4 of 12 positions shown · non-contrast
Comparison: 10/20/2018

CLINICAL DATA: Patient returns after screening study for evaluation
of possible RIGHT breast asymmetry.

EXAM:
DIGITAL DIAGNOSTIC RIGHT MAMMOGRAM WITH TOMO
ULTRASOUND RIGHT BREAST

[R MLO synth-2D]
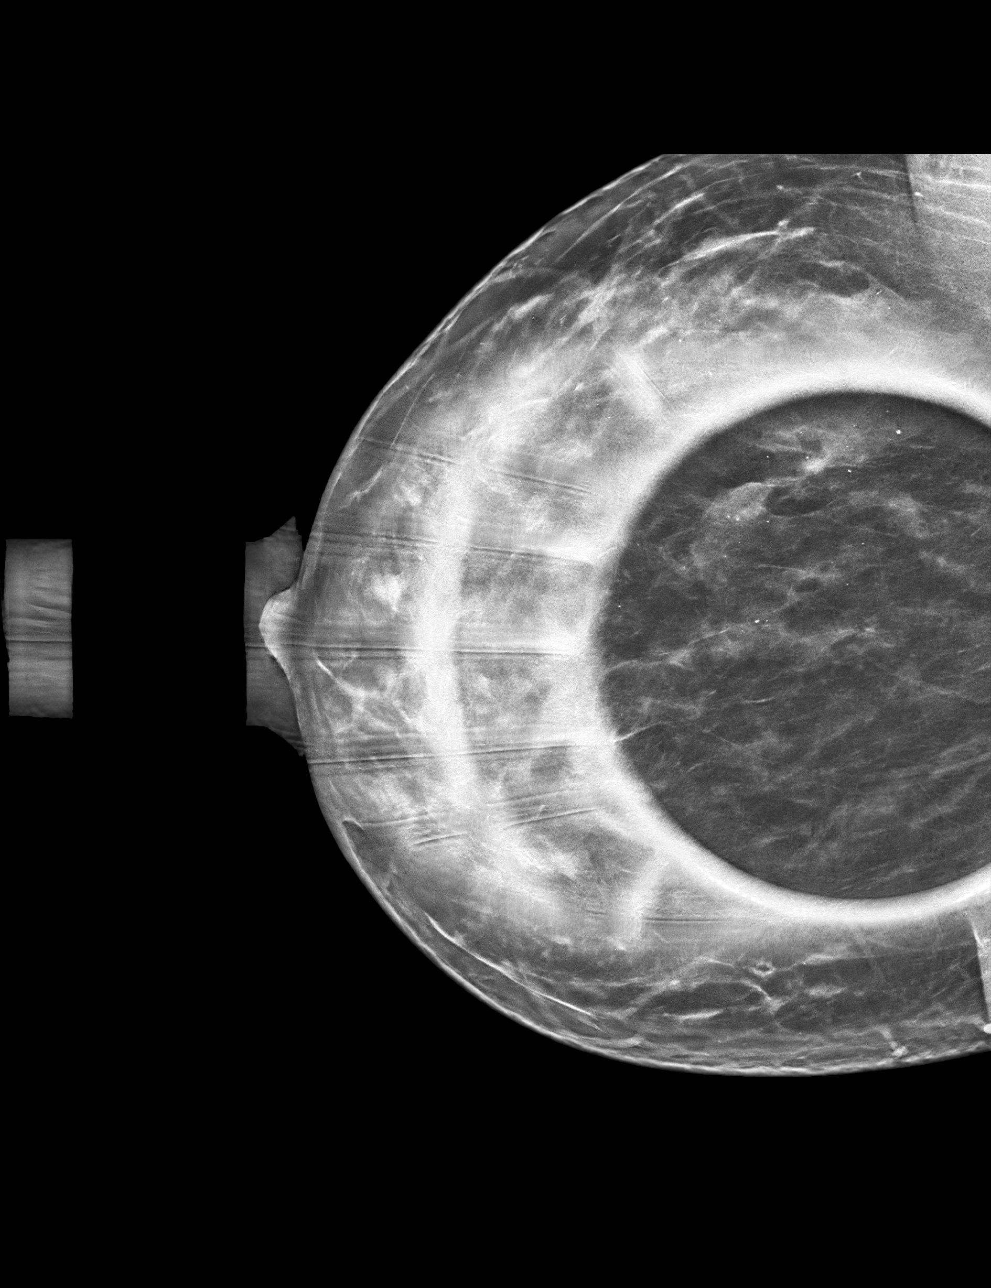

[R CC synth-2D]
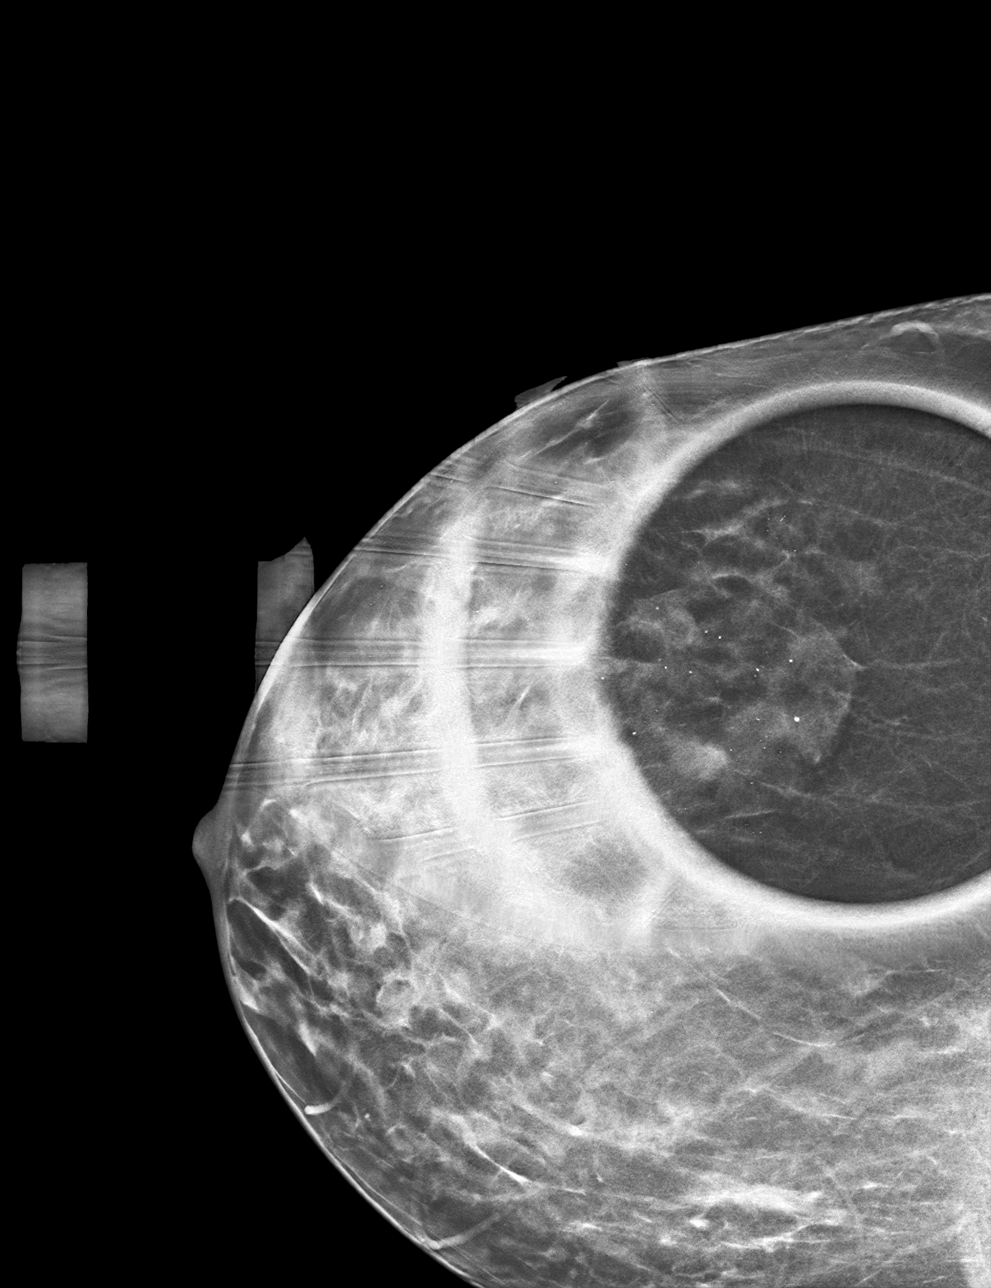

[R CC tomo · tomo slice 27/53.0]
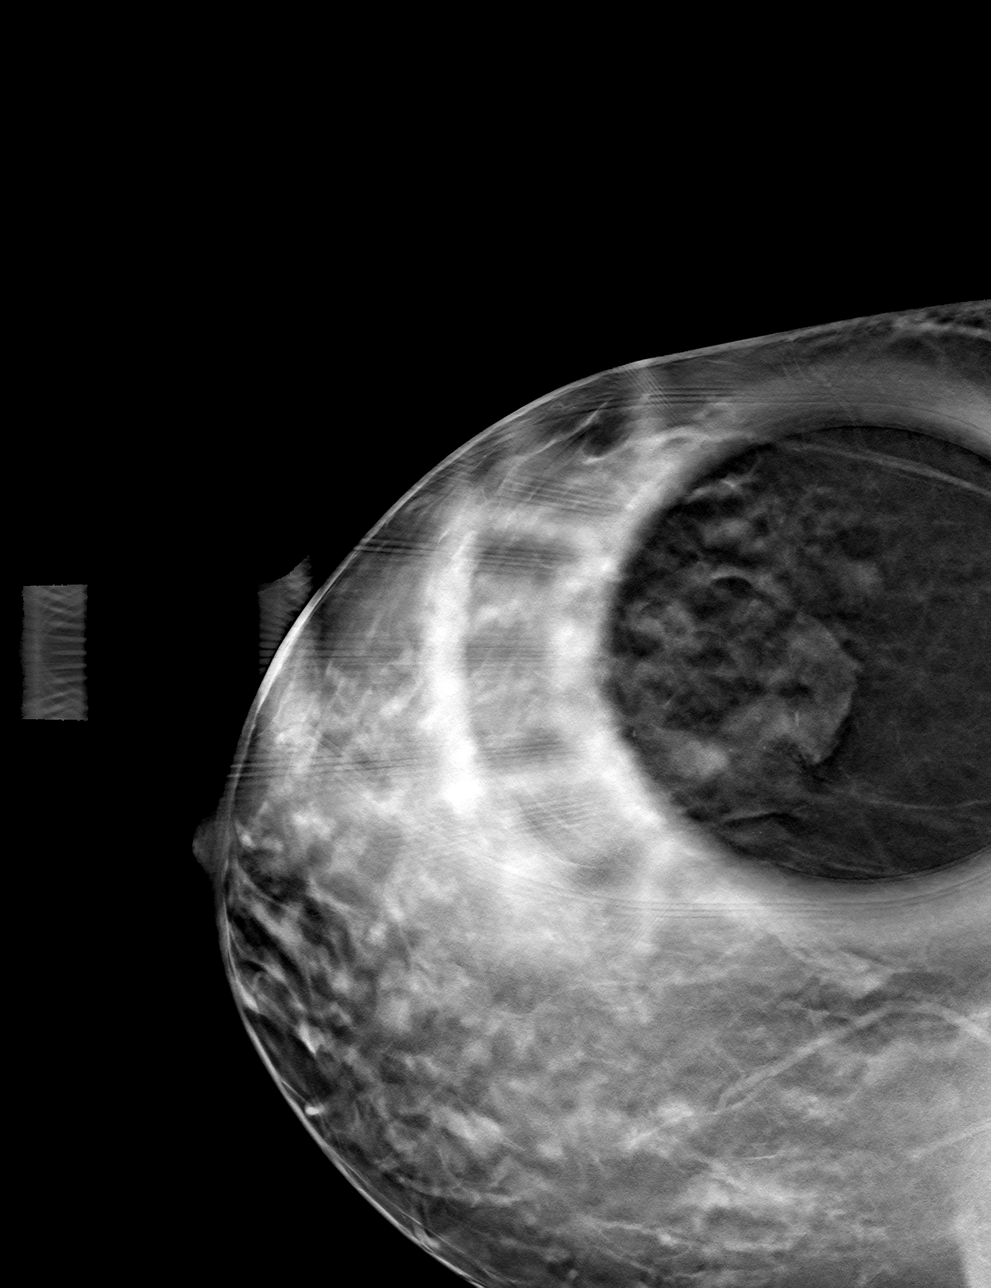

[R MLO tomo · tomo slice 31/61.0]
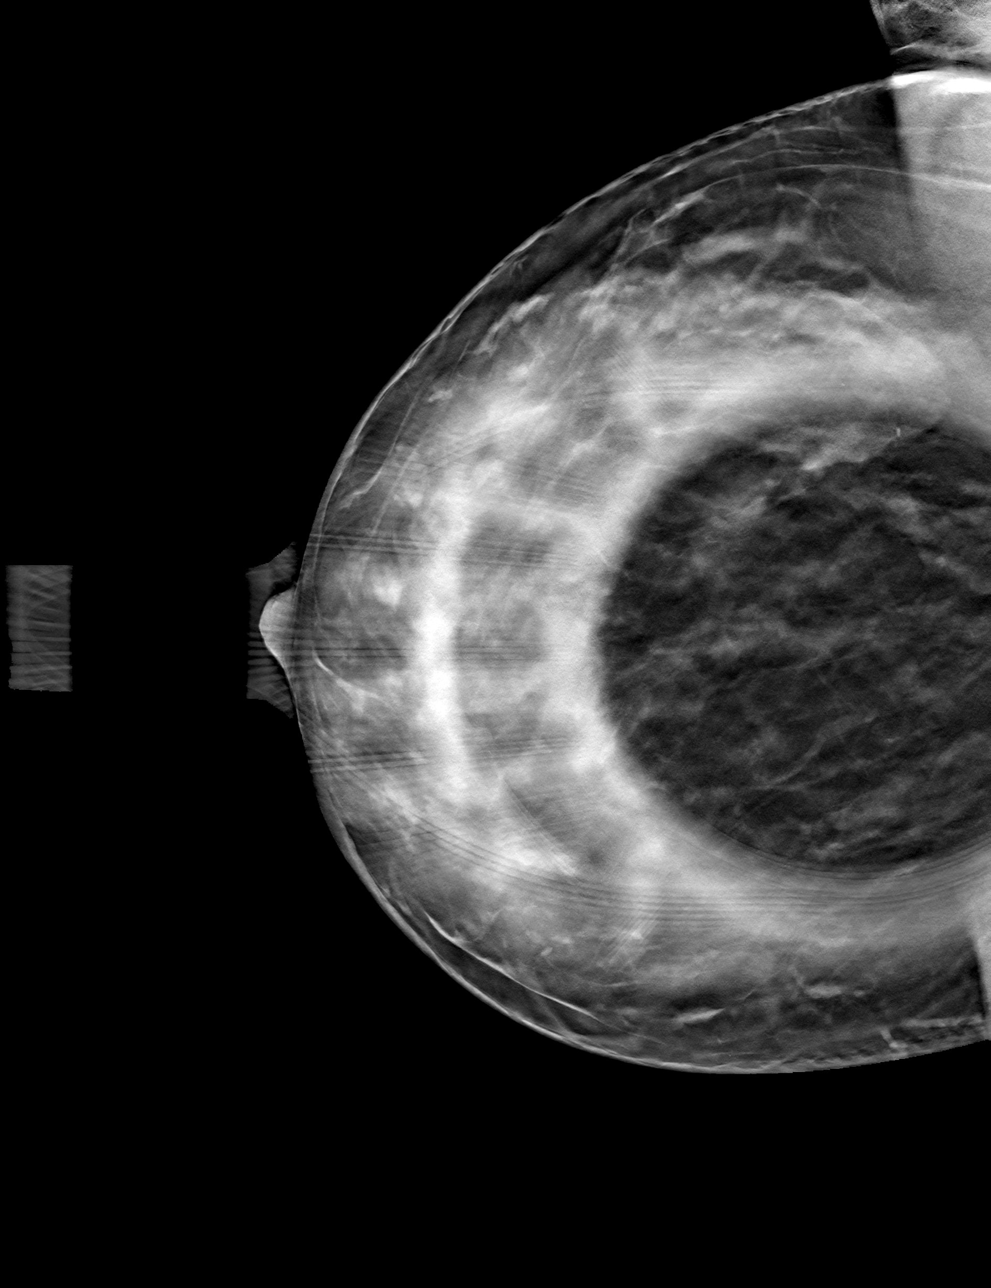

[4 of 12 positions shown; findings below may reference images not displayed]

ACR Breast Density Category c: The breast tissue is heterogeneously
dense, which may obscure small masses.
FINDINGS: Additional 2-D and 3-D images are performed. These views show
asymmetric density without discrete mass in the LATERAL aspect of
the RIGHT breast.

Targeted ultrasound is performed, showing a simple cyst in the 8
o'clock location of the RIGHT breast 6 centimeters from the nipple
which measures 1.0 x 0.5 x 0.5 centimeters. Within the 10 o'clock
location of the RIGHT breast, dense fibroglandular tissue is imaged,
likely accounting for the mammographic abnormality. No suspicious
mass, distortion, or acoustic shadowing is demonstrated with
ultrasound.
IMPRESSION: Asymmetry in the RIGHT breast favored to represent benign breast
parenchyma.

RECOMMENDATION:
Recommend RIGHT diagnostic mammogram and possible ultrasound in 6
months to assess stability.

I have discussed the findings and recommendations with the patient.
Results were also provided in writing at the conclusion of the
visit. If applicable, a reminder letter will be sent to the patient
regarding the next appointment.

BI-RADS CATEGORY  3: Probably benign.

## 2022-09-29 ENCOUNTER — Encounter: Payer: Self-pay | Admitting: Nurse Practitioner

## 2022-11-20 ENCOUNTER — Other Ambulatory Visit: Payer: Self-pay | Admitting: Family Medicine

## 2022-11-20 DIAGNOSIS — Z0000 Encounter for general adult medical examination without abnormal findings: Secondary | ICD-10-CM

## 2022-11-20 DIAGNOSIS — R7303 Prediabetes: Secondary | ICD-10-CM

## 2022-11-20 DIAGNOSIS — E7800 Pure hypercholesterolemia, unspecified: Secondary | ICD-10-CM

## 2022-11-20 DIAGNOSIS — I10 Essential (primary) hypertension: Secondary | ICD-10-CM

## 2022-11-25 ENCOUNTER — Other Ambulatory Visit: Payer: No Typology Code available for payment source

## 2022-11-25 DIAGNOSIS — Z0000 Encounter for general adult medical examination without abnormal findings: Secondary | ICD-10-CM

## 2022-11-25 DIAGNOSIS — E7800 Pure hypercholesterolemia, unspecified: Secondary | ICD-10-CM

## 2022-11-25 DIAGNOSIS — R7303 Prediabetes: Secondary | ICD-10-CM

## 2022-11-26 LAB — LIPID PANEL
Chol/HDL Ratio: 5.9 ratio — ABNORMAL HIGH (ref 0.0–4.4)
Cholesterol, Total: 220 mg/dL — ABNORMAL HIGH (ref 100–199)
HDL: 37 mg/dL — ABNORMAL LOW (ref >39)
LDL Chol Calc (NIH): 151 mg/dL — ABNORMAL HIGH (ref 0–99)
Triglycerides: 177 mg/dL — ABNORMAL HIGH (ref 0–149)
VLDL Cholesterol Cal: 32 mg/dL (ref 5–40)

## 2022-11-26 LAB — COMPREHENSIVE METABOLIC PANEL
ALT: 113 IU/L — ABNORMAL HIGH (ref 0–32)
AST: 25 IU/L (ref 0–40)
Albumin: 4.3 g/dL (ref 3.9–4.9)
Alkaline Phosphatase: 217 IU/L — ABNORMAL HIGH (ref 44–121)
BUN/Creatinine Ratio: 17 (ref 9–23)
BUN: 11 mg/dL (ref 6–24)
Bilirubin Total: 0.4 mg/dL (ref 0.0–1.2)
CO2: 22 mmol/L (ref 20–29)
Calcium: 9.4 mg/dL (ref 8.7–10.2)
Chloride: 103 mmol/L (ref 96–106)
Creatinine, Ser: 0.63 mg/dL (ref 0.57–1.00)
Globulin, Total: 2.3 g/dL (ref 1.5–4.5)
Glucose: 93 mg/dL (ref 70–99)
Potassium: 4.4 mmol/L (ref 3.5–5.2)
Sodium: 139 mmol/L (ref 134–144)
Total Protein: 6.6 g/dL (ref 6.0–8.5)
eGFR: 110 mL/min/{1.73_m2} (ref >59)

## 2022-11-26 LAB — HEMOGLOBIN A1C
Est. average glucose Bld gHb Est-mCnc: 143 mg/dL
Hgb A1c MFr Bld: 6.6 % — ABNORMAL HIGH (ref 4.8–5.6)

## 2022-11-26 LAB — TSH: TSH: 1.31 u[IU]/mL (ref 0.450–4.500)

## 2022-12-02 ENCOUNTER — Ambulatory Visit (INDEPENDENT_AMBULATORY_CARE_PROVIDER_SITE_OTHER): Payer: No Typology Code available for payment source | Admitting: Family Medicine

## 2022-12-02 ENCOUNTER — Encounter: Payer: Self-pay | Admitting: Family Medicine

## 2022-12-02 VITALS — BP 115/86 | HR 91 | Resp 18 | Ht 64.5 in | Wt 218.0 lb

## 2022-12-02 DIAGNOSIS — E119 Type 2 diabetes mellitus without complications: Secondary | ICD-10-CM

## 2022-12-02 DIAGNOSIS — Z23 Encounter for immunization: Secondary | ICD-10-CM | POA: Diagnosis not present

## 2022-12-02 DIAGNOSIS — Z0000 Encounter for general adult medical examination without abnormal findings: Secondary | ICD-10-CM

## 2022-12-02 DIAGNOSIS — R748 Abnormal levels of other serum enzymes: Secondary | ICD-10-CM | POA: Diagnosis not present

## 2022-12-02 DIAGNOSIS — E782 Mixed hyperlipidemia: Secondary | ICD-10-CM

## 2022-12-02 DIAGNOSIS — Z1212 Encounter for screening for malignant neoplasm of rectum: Secondary | ICD-10-CM

## 2022-12-02 DIAGNOSIS — R030 Elevated blood-pressure reading, without diagnosis of hypertension: Secondary | ICD-10-CM

## 2022-12-02 DIAGNOSIS — E1169 Type 2 diabetes mellitus with other specified complication: Secondary | ICD-10-CM | POA: Diagnosis not present

## 2022-12-02 DIAGNOSIS — Z1211 Encounter for screening for malignant neoplasm of colon: Secondary | ICD-10-CM

## 2022-12-02 NOTE — Progress Notes (Signed)
Complete physical exam  Patient: Sarah Fernandez   DOB: November 25, 1975   47 y.o. Female  MRN: [id number]  Subjective:    Chief Complaint  Patient presents with   Annual Exam    Sarah Fernandez is a 47 y.o. female who presents today for a complete physical exam. She reports consuming a  low-carb  diet ever since seeing her lab results.  She generally feels fairly well. She does have additional problems to discuss today.  About a month ago she had significant epigastric pain, constipation, decreased appetite that was only alleviated with hot baths.  She tried MiraLAX without success, mineral oil finally produced a bowel movement which alleviated her pain.  She does have an upcoming appointment with gastroenterology.  Denies fever or chills at the time, denies nausea or vomiting.   Most recent fall risk assessment:    12/02/2022    2:44 PM  Fall Risk   Falls in the past year? 0  Number falls in past yr: 0  Injury with Fall? 0  Risk for fall due to : No Fall Risks  Follow up Falls evaluation completed     Most recent depression and anxiety screenings:    12/02/2022    2:44 PM 11/09/2018   11:20 AM  PHQ 2/9 Scores  PHQ - 2 Score 0 0  PHQ- 9 Score 0 0      12/02/2022    2:44 PM  GAD 7 : Generalized Anxiety Score  Nervous, Anxious, on Edge 1  Control/stop worrying 0  Worry too much - different things 0  Trouble relaxing 0  Restless 0  Easily annoyed or irritable 1  Afraid - awful might happen 0  Total GAD 7 Score 2  Anxiety Difficulty Not difficult at all    Patient Active Problem List   Diagnosis Date Noted   Mixed diabetic hyperlipidemia associated with type 2 diabetes mellitus (HCC) 11/09/2018   Type 2 diabetes mellitus without complications (HCC) 11/09/2018   BMI 33.0-33.9,adult 08/09/2018   Irregular menses 02/06/2012   White coat syndrome without diagnosis of hypertension 04/05/2008    Past Surgical History:  Procedure Laterality Date   MYRINGOTOMY WITH TUBE  PLACEMENT     TONSILLECTOMY AND ADENOIDECTOMY     Social History   Tobacco Use   Smoking status: Never    Passive exposure: Never   Smokeless tobacco: Never  Vaping Use   Vaping status: Never Used  Substance Use Topics   Alcohol use: No   Drug use: No   Family History  Problem Relation Age of Onset   Diabetes Mother    Hypertension Mother    Hyperlipidemia Mother    Hypertension Father    Diabetes Father    Cancer Father        lung   Hyperlipidemia Father    Heart disease Maternal Grandmother        MI   Heart disease Maternal Grandfather 100       MI   Heart attack Maternal Grandfather    Diabetes Sister    Breast cancer Maternal Aunt 58   Allergies  Allergen Reactions   Flonase [Fluticasone Propionate]     Burned her nose      Patient Care Team: Sarah Quitter, PA as PCP - General (Family Medicine) Zola Button, Grayling Congress, DO as Consulting Physician (Family Medicine) Mitchel Honour, DO as Consulting Physician (Obstetrics and Gynecology) Conley Rolls, [place], [name] as Referring Physician (Optometry)   Outpatient Medications  Prior to Visit  Medication Sig   acetaminophen (TYLENOL) 500 MG tablet Take 500 mg by mouth every 6 (six) hours as needed.   cetirizine (ZYRTEC) 5 MG chewable tablet Chew 5 mg by mouth as needed for allergies.   Cholecalciferol (VITAMIN D-3) 25 MCG (1000 UT) CAPS Take 2 capsules by mouth daily.   diphenhydrAMINE-zinc acetate (BENADRYL) cream Apply 1 Application topically 3 (three) times daily as needed for itching.   famotidine (PEPCID) 20 MG tablet Take 20 mg by mouth daily.   meloxicam (MOBIC) 7.5 MG tablet Take 7.5 mg by mouth as needed for pain.   Multiple Vitamins-Minerals (MULTIVITAMIN WITH MINERALS) tablet Take 1 tablet by mouth daily.   [DISCONTINUED] Polyethyl Glycol-Propyl Glycol (SYSTANE) 0.4-0.3 % SOLN Apply 1 drop to eye daily. (Patient not taking: Reported on 12/02/2022)   No facility-administered medications prior to visit.    Review  of Systems  Constitutional:  Negative for chills, fever and malaise/fatigue.  HENT:  Negative for congestion and hearing loss.   Eyes:  Negative for blurred vision and double vision.  Respiratory:  Negative for cough and shortness of breath.   Cardiovascular:  Negative for chest pain, palpitations and leg swelling.  Gastrointestinal:  Positive for abdominal pain and constipation. Negative for diarrhea, heartburn, nausea and vomiting.  Genitourinary:  Negative for frequency and urgency.  Musculoskeletal:  Negative for myalgias and neck pain.  Neurological:  Negative for headaches.  Endo/Heme/Allergies:  Negative for polydipsia.  Psychiatric/Behavioral:  Negative for depression. The patient is not nervous/anxious.       Objective:    BP 115/86 Comment: home BP  Pulse 91   Resp 18   Ht 5' 4.5" (1.638 m)   Wt 218 lb (98.9 kg)   LMP  (LMP Unknown)   SpO2 96%   BMI 36.84 kg/m    Physical Exam Constitutional:      General: She is not in acute distress.    Appearance: Normal appearance.  HENT:     Head: Normocephalic and atraumatic.     Right Ear: Tympanic membrane, ear canal and external ear normal.     Left Ear: Tympanic membrane, ear canal and external ear normal.     Nose: Nose normal.     Mouth/Throat:     Mouth: Mucous membranes are moist.     Pharynx: No oropharyngeal exudate or posterior oropharyngeal erythema.  Eyes:     Extraocular Movements: Extraocular movements intact.     Conjunctiva/sclera: Conjunctivae normal.     Pupils: Pupils are equal, round, and reactive to light.  Neck:     Thyroid: No thyroid mass, thyromegaly or thyroid tenderness.  Cardiovascular:     Rate and Rhythm: Normal rate and regular rhythm.     Heart sounds: Normal heart sounds. No murmur heard.    No friction rub. No gallop.  Pulmonary:     Effort: Pulmonary effort is normal. No respiratory distress.     Breath sounds: Normal breath sounds. No wheezing, rhonchi or rales.  Abdominal:      General: Abdomen is flat. Bowel sounds are normal. There is no distension.     Palpations: There is no mass.     Tenderness: There is no abdominal tenderness. There is no guarding.  Musculoskeletal:        General: Normal range of motion.     Cervical back: Normal range of motion and neck supple.  Lymphadenopathy:     Cervical: No cervical adenopathy.  Skin:    General:  Skin is warm and dry.  Neurological:     Mental Status: She is alert and oriented to person, place, and time.     Cranial Nerves: No cranial nerve deficit.     Motor: No weakness.     Deep Tendon Reflexes: Reflexes normal.  Psychiatric:        Mood and Affect: Mood normal.       Assessment & Plan:    Routine Health Maintenance and Physical Exam  Immunization History  Administered Date(s) Administered   Hep A / Hep B 06/30/2008, 07/07/2008, 08/04/2008, 10/08/2010   Influenza Split 12/10/2010   Influenza, Seasonal, Injecte, Preservative Fre 12/02/2022   Influenza,inj,Quad PF,6+ Mos 11/09/2018   PFIZER(Purple Top)SARS-COV-2 Vaccination 05/14/2019, 06/07/2019   Td 06/18/2005   Tdap 11/09/2018    Health Maintenance  Topic Date Due   FOOT EXAM  Never done   OPHTHALMOLOGY EXAM  Never done   HIV Screening  Never done   Diabetic kidney evaluation - Urine ACR  Never done   Hepatitis C Screening  Never done   Colonoscopy  Never done   Cervical Cancer Screening (HPV/Pap Cotest)  10/19/2021   COVID-19 Vaccine (3 - 2023-24 season) 12/18/2022 (Originally 11/02/2022)   HEMOGLOBIN A1C  05/25/2023   Diabetic kidney evaluation - eGFR measurement  11/25/2023   DTaP/Tdap/Td (3 - Td or Tdap) 11/08/2028   INFLUENZA VACCINE  Completed   HPV VACCINES  Aged Out    Reviewed most recent labs including CBC, CMP, lipid panel, A1C, TSH, and vitamin D. All within normal limits/stable from last check other than A1c 6.6 and new diagnosis of diabetes, LDL increased to 151 from 161, triglycerides increased to 177, HDL decreased to 37,  alkaline phosphatase doubled to 217, ALT almost 4 times upper limits of normal at 113.  Evaluating with [facility] [place]. Pap smear and mammogram will be updated by OB/GYN at [facility].  Discussed health benefits of physical activity, and encouraged her to engage in regular exercise appropriate for her age and condition.  Wellness examination  Need for influenza vaccination -     Flu vaccine trivalent PF, 6mos and older(Flulaval,Afluria,Fluarix,Fluzone)  Elevated liver enzymes -     US ABDOMEN LIMITED RUQ (LIVER/GB); Future  Screening for colorectal cancer -     Cologuard  Type 2 diabetes mellitus without complication, without long-term current use of insulin (HCC) Assessment & Plan: A1c 6.6 meets criteria for diagnosis of diabetes.  Encouraged to continue with lifestyle changes including physical activity and limiting carbs/sugar.  Praised her for already switching from Dr. Reino Kent to Dr. Reino Kent 0 sugar.  Discussed that we will recheck A1c in 3 months.  Otherwise, annually at a minimum will check lipid panel, CMP for GFR and creatinine, urine microalbumin.  She will also need an annual foot exam and retinal eye exam.   Mixed diabetic hyperlipidemia associated with type 2 diabetes mellitus (HCC) Assessment & Plan: Last lipid panel: LDL 151, HDL 37, triglycerides 177.  Encourage patient to continue with lifestyle changes in nutrition and increase physical activity. The 10-year ASCVD risk score (Arnett DK, et al., 2019) is: 3.1%.  She does have a family history of hyperlipidemia as well.  Will discuss statin therapy which is generally recommended for anyone with diabetes.   White coat syndrome without diagnosis of hypertension Assessment & Plan: Blood pressure in office 162/104, at home blood pressure log with values of 115/86, 115/86, 100/86, 104/77.     Return in about 3 months (around 03/04/2023)  for follow-up for DM.     Sarah Quitter, PA

## 2022-12-02 NOTE — Assessment & Plan Note (Signed)
Last lipid panel: LDL 151, HDL 37, triglycerides 177.  Encourage patient to continue with lifestyle changes in nutrition and increase physical activity. The 10-year ASCVD risk score (Arnett DK, et al., 2019) is: 3.1%.  She does have a family history of hyperlipidemia as well.  Will discuss statin therapy which is generally recommended for anyone with diabetes.

## 2022-12-02 NOTE — Patient Instructions (Signed)
You have fantastic habits in place already, so keep up the good work!  Thank you so much for allowing me to be part of your healthcare team.

## 2022-12-02 NOTE — Assessment & Plan Note (Signed)
Blood pressure in office 162/104, at home blood pressure log with values of 115/86, 115/86, 100/86, 104/77.

## 2022-12-02 NOTE — Assessment & Plan Note (Signed)
A1c 6.6 meets criteria for diagnosis of diabetes.  Encouraged to continue with lifestyle changes including physical activity and limiting carbs/sugar.  Praised her for already switching from Dr. Reino Kent to Dr. Reino Kent 0 sugar.  Discussed that we will recheck A1c in 3 months.  Otherwise, annually at a minimum will check lipid panel, CMP for GFR and creatinine, urine microalbumin.  She will also need an annual foot exam and retinal eye exam.

## 2022-12-08 ENCOUNTER — Ambulatory Visit
Admission: RE | Admit: 2022-12-08 | Discharge: 2022-12-08 | Disposition: A | Discharge status: Home / Self Care | Payer: No Typology Code available for payment source | Source: Ambulatory Visit | Attending: Family Medicine

## 2022-12-08 DIAGNOSIS — R748 Abnormal levels of other serum enzymes: Secondary | ICD-10-CM

## 2022-12-10 ENCOUNTER — Ambulatory Visit: Payer: No Typology Code available for payment source | Admitting: Nurse Practitioner

## 2022-12-17 ENCOUNTER — Encounter: Payer: Self-pay | Admitting: Physician Assistant

## 2022-12-17 ENCOUNTER — Ambulatory Visit (INDEPENDENT_AMBULATORY_CARE_PROVIDER_SITE_OTHER): Payer: No Typology Code available for payment source | Admitting: Physician Assistant

## 2022-12-17 ENCOUNTER — Other Ambulatory Visit (INDEPENDENT_AMBULATORY_CARE_PROVIDER_SITE_OTHER): Payer: No Typology Code available for payment source

## 2022-12-17 VITALS — BP 148/82 | HR 88 | Ht 64.5 in | Wt 218.0 lb

## 2022-12-17 DIAGNOSIS — E119 Type 2 diabetes mellitus without complications: Secondary | ICD-10-CM

## 2022-12-17 DIAGNOSIS — R7989 Other specified abnormal findings of blood chemistry: Secondary | ICD-10-CM | POA: Diagnosis not present

## 2022-12-17 DIAGNOSIS — K219 Gastro-esophageal reflux disease without esophagitis: Secondary | ICD-10-CM

## 2022-12-17 DIAGNOSIS — K5904 Chronic idiopathic constipation: Secondary | ICD-10-CM | POA: Diagnosis not present

## 2022-12-17 DIAGNOSIS — Z6833 Body mass index (BMI) 33.0-33.9, adult: Secondary | ICD-10-CM

## 2022-12-17 LAB — CBC WITH DIFFERENTIAL/PLATELET
Basophils Absolute: 0 10*3/uL (ref 0.0–0.1)
Basophils Relative: 0.6 % (ref 0.0–3.0)
Eosinophils Absolute: 0.1 10*3/uL (ref 0.0–0.7)
Eosinophils Relative: 1 % (ref 0.0–5.0)
HCT: 43.5 % (ref 36.0–46.0)
Hemoglobin: 14.2 g/dL (ref 12.0–15.0)
Lymphocytes Relative: 23.3 % (ref 12.0–46.0)
Lymphs Abs: 1.7 10*3/uL (ref 0.7–4.0)
MCHC: 32.6 g/dL (ref 30.0–36.0)
MCV: 80.1 fL (ref 78.0–100.0)
Monocytes Absolute: 0.5 10*3/uL (ref 0.1–1.0)
Monocytes Relative: 6.4 % (ref 3.0–12.0)
Neutro Abs: 4.9 10*3/uL (ref 1.4–7.7)
Neutrophils Relative %: 68.7 % (ref 43.0–77.0)
Platelets: 261 10*3/uL (ref 150.0–400.0)
RBC: 5.43 Mil/uL — ABNORMAL HIGH (ref 3.87–5.11)
RDW: 15.7 % — ABNORMAL HIGH (ref 11.5–15.5)
WBC: 7.1 10*3/uL (ref 4.0–10.5)

## 2022-12-17 LAB — HEPATIC FUNCTION PANEL
ALT: 38 U/L — ABNORMAL HIGH (ref 0–35)
AST: 22 U/L (ref 0–37)
Albumin: 4.2 g/dL (ref 3.5–5.2)
Alkaline Phosphatase: 139 U/L — ABNORMAL HIGH (ref 39–117)
Bilirubin, Direct: 0.1 mg/dL (ref 0.0–0.3)
Total Bilirubin: 0.4 mg/dL (ref 0.2–1.2)
Total Protein: 7.4 g/dL (ref 6.0–8.3)

## 2022-12-17 LAB — GAMMA GT: GGT: 143 U/L — ABNORMAL HIGH (ref 7–51)

## 2022-12-17 LAB — IBC + FERRITIN
Ferritin: 47.5 ng/mL (ref 10.0–291.0)
Iron: 45 ug/dL (ref 42–145)
Saturation Ratios: 13.5 % — ABNORMAL LOW (ref 20.0–50.0)
TIBC: 333.2 ug/dL (ref 250.0–450.0)
Transferrin: 238 mg/dL (ref 212.0–360.0)

## 2022-12-17 LAB — PROTIME-INR
INR: 1.2 {ratio} — ABNORMAL HIGH (ref 0.8–1.0)
Prothrombin Time: 12.4 s (ref 9.6–13.1)

## 2022-12-17 NOTE — Progress Notes (Signed)
12/17/2022 Sarah Fernandez [id number] April 30, 1975  Referring provider: No ref. provider found Primary GI doctor: Dr. Myrtie Neither  ASSESSMENT AND PLAN:   Constipation Episode of constipation two months ago with abdominal pain, resolved with mineral oil. No recurrence. Likely stress-related. No blood in stool. Regular bowel movements currently. -Continue high fiber diet and hydration.  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  Must exclude other chronic causes of hepatocellular inflammation that can mimic fatty liver on ultrasound. - Labs to include: iron, ferritin, TIBC,  IgG, ANA, Antismooth muscle antibody, AMA, celiac, thyroid, alpha-one-antitrypsin level - need LFTs and CBC monitored every 6 months, revaluation every 2-3 years.  - Consider elastography --Continue to work on risk factor modification including diet exercise and control of risk factors including blood sugars.  Gallstones Gallstones noted on ultrasound. No current symptoms of gallbladder disease. Family history of gallbladder issues. -Monitor for symptoms of gallbladder disease. If symptoms develop, consider surgical consultation.  Colon Cancer Screening Cologuard test submitted recently. No family history of colon cancer. -Await results of Cologuard test. If positive, schedule colonoscopy. If negative, repeat screening in three years.        Patient Care Team: Melida Quitter, PA as PCP - General (Family Medicine) Zola Button, Grayling Congress, DO as Consulting Physician (Family Medicine) Mitchel Honour, DO as Consulting Physician (Obstetrics and Gynecology) Conley Rolls, [place], [name] as Referring Physician (Optometry)  HISTORY OF PRESENT ILLNESS: 47 y.o. female with a past medical history of diabetes last A1c 6.6, hyperlipidemia and others listed below presents for evaluation of  abdominal pain and elevated liver function.   12/08/2022  right upper quadrant abdominal ultrasound shows cholelithiasis without secondary signs of acute cholecystitis, increased hepatic parenchymal echogenicity suggestive of steatosis Given Cologuard yesterday 11/25/2022 alk phos 217, AST 25, ALT 113, 4 years prior alkaline phosphatase was 127 otherwise no elevated LFTs prior  Discussed the use of AI scribe software for clinical note transcription with the patient, who gave verbal consent to proceed.  History of Present Illness   The patient, with a history of plantar fasciitis, presents after a recent episode of constipation lasting over four days.  She describes the discomfort as 'hurting' in the lower abdomen. The constipation resolved with mineral oil. She denies diarrhea, blood in stool, and current abdominal pain.   She submitted a Cologuard test two days prior to this visit. The patient also reports a recent period of stress due to a family bereavement. She admits to poor eating habits during this time. She denies any changes in medication or physical activity. She has since made dietary changes, including reducing sodium and sugar intake, and has lost eleven pounds.   She reports a recent physical exam revealed elevated liver enzymes. An ultrasound was performed, showing gallstones and fatty liver, but no signs of inflammation. She denies a family history of colon cancer and liver disease. She denies alcohol and drug use, and reports occasional use of ibuprofen for plantar fasciitis and Tylenol as needed. She denies nausea, vomiting, heartburn, trouble swallowing, itching, yellowing of eyes or skin, and dark urine.         Latest Ref Rng & Units 11/25/2022    9:31 AM 06/24/2019    8:45 AM 10/04/2018    9:08 AM  Hepatic Function  Total Protein 6.0 - 8.5 g/dL 6.6  6.6  6.7   Albumin 3.9 - 4.9 g/dL 4.3  4.1  4.1  AST 0 - 40 IU/L 25  18  17    ALT 0 - 32 IU/L 113  22  18   Alk Phosphatase 44 - 121 IU/L 217  104  127   Total Bilirubin 0.0 - 1.2  mg/dL 0.4  0.3  0.3   Bilirubin, Direct 0.00 - 0.40 mg/dL  2.95      She denies blood thinner use.  She reports NSAID use, ibuprofen as needed for plantar fascitis. Tylenol takes once or twice a month.  She denies ETOH use.   She denies tobacco use.  She denies drug use.    She  reports that she has never smoked. She has never been exposed to tobacco smoke. She has never used smokeless tobacco. She reports that she does not drink alcohol and does not use drugs.  RELEVANT LABS AND IMAGING:  Results   LABS Liver enzymes: elevated  RADIOLOGY Abdominal ultrasound: gallstones, fatty liver (steatosis)      CBC    Component Value Date/Time   WBC 9.2 10/04/2018 0908   WBC 7.5 08/30/2010 0955   RBC 5.40 (H) 10/04/2018 0908   RBC 4.95 08/30/2010 0955   HGB 13.0 10/04/2018 0908   HCT 40.9 10/04/2018 0908   PLT 271 10/04/2018 0908   MCV 76 (L) 10/04/2018 0908   MCH 24.1 (L) 10/04/2018 0908   MCHC 31.8 10/04/2018 0908   MCHC 33.5 08/30/2010 0955   RDW 15.8 (H) 10/04/2018 0908   LYMPHSABS 2.1 10/04/2018 0908   MONOABS 0.4 08/30/2010 0955   EOSABS 0.1 10/04/2018 0908   BASOSABS 0.0 10/04/2018 0908   No results for input(s): "HGB" in the last 8760 hours.  CMP     Component Value Date/Time   NA 139 11/25/2022 0931   K 4.4 11/25/2022 0931   CL 103 11/25/2022 0931   CO2 22 11/25/2022 0931   GLUCOSE 93 11/25/2022 0931   GLUCOSE 82 08/30/2010 0955   BUN 11 11/25/2022 0931   CREATININE 0.63 11/25/2022 0931   CALCIUM 9.4 11/25/2022 0931   PROT 6.6 11/25/2022 0931   ALBUMIN 4.3 11/25/2022 0931   AST 25 11/25/2022 0931   ALT 113 (H) 11/25/2022 0931   ALKPHOS 217 (H) 11/25/2022 0931   BILITOT 0.4 11/25/2022 0931   GFRNONAA 110 10/04/2018 0908   GFRAA 127 10/04/2018 0908      Latest Ref Rng & Units 11/25/2022    9:31 AM 06/24/2019    8:45 AM 10/04/2018    9:08 AM  Hepatic Function  Total Protein 6.0 - 8.5 g/dL 6.6  6.6  6.7   Albumin 3.9 - 4.9 g/dL 4.3  4.1  4.1   AST 0 - 40  IU/L 25  18  17    ALT 0 - 32 IU/L 113  22  18   Alk Phosphatase 44 - 121 IU/L 217  104  127   Total Bilirubin 0.0 - 1.2 mg/dL 0.4  0.3  0.3   Bilirubin, Direct 0.00 - 0.40 mg/dL  2.84        Current Medications:     Current Outpatient Medications (Respiratory):    cetirizine (ZYRTEC) 5 MG chewable tablet, Chew 5 mg by mouth as needed for allergies.  Current Outpatient Medications (Analgesics):    acetaminophen (TYLENOL) 500 MG tablet, Take 500 mg by mouth every 6 (six) hours as needed.   meloxicam (MOBIC) 7.5 MG tablet, Take 7.5 mg by mouth as needed for pain.   Current Outpatient Medications (Other):    Cholecalciferol (  VITAMIN D-3) 25 MCG (1000 UT) CAPS, Take 2 capsules by mouth daily.   diphenhydrAMINE-zinc acetate (BENADRYL) cream, Apply 1 Application topically 3 (three) times daily as needed for itching.   famotidine (PEPCID) 20 MG tablet, Take 20 mg by mouth daily.   Multiple Vitamins-Minerals (MULTIVITAMIN WITH MINERALS) tablet, Take 1 tablet by mouth daily.  Medical History:  Past Medical History:  Diagnosis Date   Diabetes (HCC)    Hyperlipidemia    Allergies:  Allergies  Allergen Reactions   Flonase [Fluticasone Propionate]     Burned her nose      Surgical History:  She  has a past surgical history that includes Myringotomy with tube placement and Tonsillectomy and adenoidectomy. Family History:  Her family history includes Breast cancer (age of onset: 2) in her maternal aunt; Cancer in her father; Diabetes in her father, mother, and sister; Heart attack in her maternal grandfather; Heart disease in her maternal grandmother; Heart disease (age of onset: 64) in her maternal grandfather; Hyperlipidemia in her father and mother; Hypertension in her father and mother.  REVIEW OF SYSTEMS  : All other systems reviewed and negative except where noted in the History of Present Illness.  PHYSICAL EXAM: BP (!) 148/82   Pulse 88   Ht 5' 4.5" (1.638 m)   Wt 218 lb  (98.9 kg)   LMP  (LMP Unknown)   BMI 36.84 kg/m  General Appearance: Well nourished, in no apparent distress. Head:   Normocephalic and atraumatic. Eyes:  sclerae anicteric,conjunctive pink  Respiratory: Respiratory effort normal, BS equal bilaterally without rales, rhonchi, wheezing. Cardio: RRR with no MRGs. Peripheral pulses intact.  Abdomen: Soft,  Obese ,active bowel sounds. No tenderness. No masses. Rectal: Not evaluated Musculoskeletal: Full ROM, Normal gait. Without edema. Skin:  Dry and intact without significant lesions or rashes Neuro: Alert and  oriented x4;  No focal deficits. Psych:  Cooperative. Normal mood and affect.    Doree Albee, PA-C 9:18 AM

## 2022-12-17 NOTE — Progress Notes (Signed)
____________________________________________________________  Attending physician addendum:  Thank you for sending this case to me. I have reviewed the entire note and agree with the plan.  Please let me know if there are any abnormalities or questions regarding the serologic testing for autoimmune and metabolic liver disease.  Amada Jupiter, MD  ____________________________________________________________

## 2022-12-17 NOTE — Patient Instructions (Addendum)
Your provider has requested that you go to the basement level for lab work before leaving today. Press "B" on the elevator. The lab is located at the first door on the left as you exit the elevator.  Metabolic dysfunction associated seatohepatitis  Now the leading cause of liver failure in the [place].  It is normally from such risk factors as obesity, diabetes, insulin resistance, high cholesterol, or metabolic syndrome.  The only definitive therapy is weight loss and exercise.   Suggest walking 20-30 mins daily.  Decreasing carbohydrates, increasing veggies.    Fatty Liver Fatty liver is the accumulation of fat in liver cells. It is also called hepatosteatosis or steatohepatitis. It is normal for your liver to contain some fat. If fat is more than 5 to 10% of your liver's weight, you have fatty liver.  There are often no symptoms (problems) for years while damage is still occurring. People often learn about their fatty liver when they have medical tests for other reasons. Fat can damage your liver for years or even decades without causing problems. When it becomes severe, it can cause fatigue, weight loss, weakness, and confusion. This makes you more likely to develop more serious liver problems. The liver is the largest organ in the body. It does a lot of work and often gives no warning signs when it is sick until late in a disease. The liver has many important jobs including: Breaking down foods. Storing vitamins, iron, and other minerals. Making proteins. Making bile for food digestion. Breaking down many products including medications, alcohol and some poisons.  PROGNOSIS  Fatty liver may cause no damage or it can lead to an inflammation of the liver. This is, called steatohepatitis.  Over time the liver may become scarred and hardened. This condition is called cirrhosis. Cirrhosis is serious and may lead to liver failure or cancer. NASH is one of the leading causes of cirrhosis.  About 10-20% of Americans have fatty liver and a smaller 2-5% has NASH.  TREATMENT  Weight loss, fat restriction, and exercise in overweight patients produces inconsistent results but is worth trying. Good control of diabetes may reduce fatty liver. Eat a balanced, healthy diet. Increase your physical activity. There are no medical or surgical treatments for a fatty liver or NASH, but improving your diet and increasing your exercise may help prevent or reverse some of the damage.   Recommend starting on a fiber supplement, can try metamucil first but if this causes gas/bloating switch to benefiber or citracel, these do not cause gas.  Take with fiber with with a full 8 oz glass of water once a day. This can take 1 month to start helping, so try for at least one month.  Recommend increasing water and physical activity.   - Drink at least 64-80 ounces of water/liquid per day. - Establish a time to try to move your bowels every day.  For many people, this is after a cup of coffee or after a meal such as breakfast. - Sit all of the way back on the toilet keeping your back fairly straight and while sitting up, try to rest the tops of your forearms on your upper thighs.   - Raising your feet with a step stool/squatty potty can be helpful to improve the angle that allows your stool to pass through the rectum. - Relax the rectum feeling it bulge toward the toilet water.  If you feel your rectum raising toward your body, you are contracting rather  than relaxing. - Breathe in and slowly exhale. "Belly breath" by expanding your belly towards your belly button. Keep belly expanded as you gently direct pressure down and back to the anus.  A low pitched GRRR sound can assist with increasing intra-abdominal pressure.  (Can also trying to blow on a pinwheel and make it move, this helps with the same belly breathing) - Repeat 3-4 times. If unsuccessful, contract the pelvic floor to restore normal tone and get  off the toilet.  Avoid excessive straining. - To reduce excessive wiping by teaching your anus to normally contract, place hands on outer aspect of knees and resist knee movement outward.  Hold 5-10 second then place hands just inside of knees and resist inward movement of knees.  Hold 5 seconds.  Repeat a few times each way.  Go to the ER if unable to pass gas, severe AB pain, unable to hold down food, any shortness of breath of chest pain.   Due to recent changes in healthcare laws, you may see the results of your imaging and laboratory studies on MyChart before your provider has had a chance to review them.  We understand that in some cases there may be results that are confusing or concerning to you. Not all laboratory results come back in the same time frame and the provider may be waiting for multiple results in order to interpret others.  Please give [place] 48 hours in order for your provider to thoroughly review all the results before contacting the office for clarification of your results.    I appreciate the  opportunity to care for you  Thank You   [facility]

## 2022-12-19 LAB — COLOGUARD: COLOGUARD: NEGATIVE

## 2022-12-20 LAB — ALPHA-1-ANTITRYPSIN: A-1 Antitrypsin, Ser: 165 mg/dL (ref 83–199)

## 2022-12-20 LAB — ANA: Anti Nuclear Antibody (ANA): NEGATIVE

## 2022-12-20 LAB — TISSUE TRANSGLUTAMINASE, IGA: (tTG) Ab, IgA: <1 U/mL

## 2022-12-20 LAB — CERULOPLASMIN: Ceruloplasmin: 34 mg/dL (ref 14–48)

## 2022-12-20 LAB — ANTI-SMOOTH MUSCLE ANTIBODY, IGG: Actin (Smooth Muscle) Antibody (IGG): <20 U (ref <20)

## 2022-12-20 LAB — IGA: Immunoglobulin A: 184 mg/dL (ref 47–310)

## 2022-12-20 LAB — IGG: IgG (Immunoglobin G), Serum: 1002 mg/dL (ref 600–1640)

## 2022-12-20 LAB — MITOCHONDRIAL ANTIBODIES: Mitochondrial M2 Ab, IgG: <=20 U (ref <=20.0)

## 2023-03-05 ENCOUNTER — Ambulatory Visit (INDEPENDENT_AMBULATORY_CARE_PROVIDER_SITE_OTHER): Payer: No Typology Code available for payment source | Admitting: Family Medicine

## 2023-03-05 VITALS — BP 109/80 | HR 89 | Temp 97.9°F | Ht 65.0 in | Wt 212.0 lb

## 2023-03-05 DIAGNOSIS — R7303 Prediabetes: Secondary | ICD-10-CM

## 2023-03-05 LAB — POCT GLYCOSYLATED HEMOGLOBIN (HGB A1C)
HbA1c POC (<> result, manual entry): 6.3 % (ref 4.0–5.6)
HbA1c, POC (prediabetic range): 6.3 % (ref 5.7–6.4)
Hemoglobin A1C: 6.3 % — AB (ref 4.0–5.6)

## 2023-03-05 NOTE — Progress Notes (Signed)
   Established Patient Office Visit  Subjective   Patient ID: Sarah Fernandez, female    DOB: Jul 29, 1975  Age: 48 y.o. MRN: [id number]  Chief Complaint  Patient presents with   Follow-up    HPI Sarah Fernandez is a 48 y.o. female presenting today for follow up of possible diabetes.  A1c on last lab work was 6.6, if elevated again today meets criteria for diagnosis of type 2 diabetes.  She has continued to only drink Dr. Nunzio 0 sugar and has lost about 18 pounds.  She has checked her blood pressure at home, over the past 4 days it has been 116/87, 115/88, 109/80, and 102/80.  Outpatient Medications Prior to Visit  Medication Sig   acetaminophen  (TYLENOL ) 500 MG tablet Take 500 mg by mouth every 6 (six) hours as needed.   cetirizine (ZYRTEC) 5 MG chewable tablet Chew 5 mg by mouth as needed for allergies.   Cholecalciferol (VITAMIN D -3) 25 MCG (1000 UT) CAPS Take 2 capsules by mouth daily.   diphenhydrAMINE -zinc acetate (BENADRYL ) cream Apply 1 Application topically 3 (three) times daily as needed for itching.   famotidine  (PEPCID ) 20 MG tablet Take 20 mg by mouth daily.   meloxicam (MOBIC) 7.5 MG tablet Take 7.5 mg by mouth as needed for pain.   Multiple Vitamins-Minerals (MULTIVITAMIN WITH MINERALS) tablet Take 1 tablet by mouth daily.   No facility-administered medications prior to visit.    ROS Negative unless otherwise noted in HPI   Objective:     BP 109/80 Comment: home BP  Pulse 89   Temp 97.9 F (36.6 C) (Temporal)   Ht 5' 5 (1.651 m)   Wt 212 lb (96.2 kg)   SpO2 99%   BMI 35.28 kg/m   Physical Exam Constitutional:      General: She is not in acute distress.    Appearance: Normal appearance.  HENT:     Head: Normocephalic and atraumatic.  Cardiovascular:     Rate and Rhythm: Normal rate and regular rhythm.     Heart sounds: No murmur heard.    No friction rub. No gallop.  Pulmonary:     Effort: Pulmonary effort is normal. No respiratory distress.      Breath sounds: No wheezing, rhonchi or rales.  Skin:    General: Skin is warm and dry.  Neurological:     Mental Status: She is alert and oriented to person, place, and time.    Results for orders placed or performed in visit on 03/05/23  POCT HgB A1C  Result Value Ref Range   Hemoglobin A1C 6.3 (A) 4.0 - 5.6 %   HbA1c POC (<> result, manual entry) 6.3 4.0 - 5.6 %   HbA1c, POC (prediabetic range) 6.3 5.7 - 6.4 %   HbA1c, POC (controlled diabetic range)    Results Console HPV  Result Value Ref Range   CHL HPV Negative     Assessment & Plan:  Prediabetes Assessment & Plan: Repeat A1c 6.3 and category for prediabetes but does not confirm diagnosis of type 2 diabetes.  Continue with nutritional interventions and physical activity.  Will continue to monitor.  Orders: -     POCT glycosylated hemoglobin (Hb A1C)    Return in about 4 months (around 07/03/2023) for follow-up for HLD, fasting blood work 1 week before.  If labs stable at that point, return to follow-up twice a year for HLD.   Joesph DELENA Sear, PA

## 2023-03-05 NOTE — Assessment & Plan Note (Signed)
 Repeat A1c 6.3 and category for prediabetes but does not confirm diagnosis of type 2 diabetes.  Continue with nutritional interventions and physical activity.  Will continue to monitor.

## 2023-04-09 ENCOUNTER — Encounter: Payer: Self-pay | Admitting: Family Medicine

## 2023-04-16 ENCOUNTER — Other Ambulatory Visit: Payer: No Typology Code available for payment source

## 2023-04-16 ENCOUNTER — Encounter: Payer: Self-pay | Admitting: Gastroenterology

## 2023-04-16 ENCOUNTER — Ambulatory Visit: Payer: No Typology Code available for payment source | Admitting: Gastroenterology

## 2023-04-16 VITALS — BP 118/84 | HR 96 | Ht 65.0 in | Wt 207.5 lb

## 2023-04-16 DIAGNOSIS — R1011 Right upper quadrant pain: Secondary | ICD-10-CM | POA: Diagnosis not present

## 2023-04-16 DIAGNOSIS — R1013 Epigastric pain: Secondary | ICD-10-CM | POA: Diagnosis not present

## 2023-04-16 DIAGNOSIS — R140 Abdominal distension (gaseous): Secondary | ICD-10-CM

## 2023-04-16 DIAGNOSIS — K219 Gastro-esophageal reflux disease without esophagitis: Secondary | ICD-10-CM

## 2023-04-16 DIAGNOSIS — K829 Disease of gallbladder, unspecified: Secondary | ICD-10-CM

## 2023-04-16 DIAGNOSIS — K7581 Nonalcoholic steatohepatitis (NASH): Secondary | ICD-10-CM

## 2023-04-16 LAB — CBC WITH DIFFERENTIAL/PLATELET
Basophils Absolute: 0.1 10*3/uL (ref 0.0–0.1)
Basophils Relative: 0.8 % (ref 0.0–3.0)
Eosinophils Absolute: 0.3 10*3/uL (ref 0.0–0.7)
Eosinophils Relative: 3.2 % (ref 0.0–5.0)
HCT: 41.7 % (ref 36.0–46.0)
Hemoglobin: 13.9 g/dL (ref 12.0–15.0)
Lymphocytes Relative: 21.6 % (ref 12.0–46.0)
Lymphs Abs: 1.9 10*3/uL (ref 0.7–4.0)
MCHC: 33.3 g/dL (ref 30.0–36.0)
MCV: 80.1 fL (ref 78.0–100.0)
Monocytes Absolute: 0.6 10*3/uL (ref 0.1–1.0)
Monocytes Relative: 6.8 % (ref 3.0–12.0)
Neutro Abs: 5.9 10*3/uL (ref 1.4–7.7)
Neutrophils Relative %: 67.6 % (ref 43.0–77.0)
Platelets: 250 10*3/uL (ref 150.0–400.0)
RBC: 5.21 Mil/uL — ABNORMAL HIGH (ref 3.87–5.11)
RDW: 15.9 % — ABNORMAL HIGH (ref 11.5–15.5)
WBC: 8.8 10*3/uL (ref 4.0–10.5)

## 2023-04-16 LAB — IBC + FERRITIN
Ferritin: 108.9 ng/mL (ref 10.0–291.0)
Iron: 22 ug/dL — ABNORMAL LOW (ref 42–145)
Saturation Ratios: 6.7 % — ABNORMAL LOW (ref 20.0–50.0)
TIBC: 330.4 ug/dL (ref 250.0–450.0)
Transferrin: 236 mg/dL (ref 212.0–360.0)

## 2023-04-16 LAB — COMPREHENSIVE METABOLIC PANEL
ALT: 228 U/L — ABNORMAL HIGH (ref 0–35)
AST: 38 U/L — ABNORMAL HIGH (ref 0–37)
Albumin: 4 g/dL (ref 3.5–5.2)
Alkaline Phosphatase: 292 U/L — ABNORMAL HIGH (ref 39–117)
BUN: 12 mg/dL (ref 6–23)
CO2: 27 meq/L (ref 19–32)
Calcium: 9.4 mg/dL (ref 8.4–10.5)
Chloride: 103 meq/L (ref 96–112)
Creatinine, Ser: 0.58 mg/dL (ref 0.40–1.20)
GFR: 107.42 mL/min (ref >60.00)
Glucose, Bld: 111 mg/dL — ABNORMAL HIGH (ref 70–99)
Potassium: 3.8 meq/L (ref 3.5–5.1)
Sodium: 139 meq/L (ref 135–145)
Total Bilirubin: 0.5 mg/dL (ref 0.2–1.2)
Total Protein: 7.3 g/dL (ref 6.0–8.3)

## 2023-04-16 LAB — LIPASE: Lipase: 26 U/L (ref 11.0–59.0)

## 2023-04-16 MED ORDER — PANTOPRAZOLE SODIUM 40 MG PO TBEC: 40.0000 mg | DELAYED_RELEASE_TABLET | Freq: Every day | ORAL | 2 refills | Status: DC

## 2023-04-16 NOTE — Progress Notes (Signed)
Chief Complaint: dyspepsia Primary GI MD: Dr. 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  Ultrasound also showed gallstones.  She is also having stress related episodic constipation and recommended to get on a high fiber diet  11/25/2022: alk phos 217, AST 25, ALT 113, 4 years prior alkaline phosphatase was 127 otherwise no elevated LFTs prior  12/17/2022: Alk phos 139, AST 22, ALT 38.  GGT 143.  Iron studies normal other than saturation 13%.  Negative celiac, ceruloplasmin, IgG, ANA, AMA, ASMA, alpha-1 antitrypsin.  -------------TODAY--------------------  Discussed the use of AI scribe software for clinical note transcription with the patient, who gave verbal consent to proceed.  History of Present Illness   Sarah Fernandez is a 48 year old female who presents with symptoms of indigestion and bloating.  She experiences episodes of gas and tightness in the right upper quadrant, described as occurring 'right up under my breast.' These episodes are not consistently related to eating but have been associated with feelings of indigestion. Symptoms sometimes start in the back and move to the front, causing bloating and a sensation of trapped gas. Pain is rated as less than a 5 out of 10 and worsens with panic. Tenderness is noted in the right upper quadrant and middle abdomen.  She describes an episode where she ate a salad and subsequently felt indigestion, followed by bloating and an inability to pass gas. This episode progressed to nausea and vomiting, which occurred three times, providing some relief. She recalls a recent incident where eating a chocolate croissant and a salad seemed to trigger her symptoms, suggesting a possible dietary component  to her reflux.  She has been taking Pepcid every morning, which she feels has helped with indigestion in the past, but questions its current effectiveness. She takes it over the counter, waits thirty minutes before eating, and drinks only water during this time. She has not been taking Mobic (meloxicam) for joint issues recently.  She mentions a family history of reflux, which she believes may contribute to her symptoms.  No recent issues with bowel movements, stating they are regular, though she experienced some constipation in the past.      Past Medical History:  Diagnosis Date   Diabetes (HCC)    Hyperlipidemia     Past Surgical History:  Procedure Laterality Date   MYRINGOTOMY WITH TUBE PLACEMENT     TONSILLECTOMY AND ADENOIDECTOMY      Current Outpatient Medications  Medication Sig Dispense Refill   acetaminophen (TYLENOL) 500 MG tablet Take 500 mg by mouth every 6 (six) hours as needed.     cetirizine (ZYRTEC) 5 MG chewable tablet Chew 5 mg by mouth as needed for allergies.     Cholecalciferol (VITAMIN D-3) 25 MCG (1000 UT) CAPS Take 2 capsules by mouth daily.     diphenhydrAMINE-zinc acetate (BENADRYL) cream Apply 1 Application topically 3 (three) times daily as needed for itching.     famotidine (PEPCID) 20 MG tablet Take 20 mg by mouth daily.     meloxicam (MOBIC) 7.5 MG tablet Take 7.5 mg by mouth as needed for pain.     Multiple Vitamins-Minerals (MULTIVITAMIN WITH MINERALS) tablet Take 1 tablet by mouth daily.     pantoprazole (PROTONIX) 40 MG tablet Take  1 tablet (40 mg total) by mouth daily. 30 tablet 2   No current facility-administered medications for this visit.    Allergies as of 04/16/2023 - Review Complete 04/16/2023  Allergen Reaction Noted   Flonase [fluticasone propionate]  10/15/2011    Family History  Problem Relation Age of Onset   Diabetes Mother    Hypertension Mother    Hyperlipidemia Mother    Hypertension Father    Diabetes Father     Cancer Father        lung   Hyperlipidemia Father    Heart disease Maternal Grandmother        MI   Heart disease Maternal Grandfather 21       MI   Heart attack Maternal Grandfather    Diabetes Sister    Breast cancer Maternal Aunt 36    Social History   Socioeconomic History   Marital status: Married    Spouse name: Not on file   Number of children: Not on file   Years of education: Not on file   Highest education level: 12th grade  Occupational History   Not on file  Tobacco Use   Smoking status: Never    Passive exposure: Never   Smokeless tobacco: Never  Vaping Use   Vaping status: Never Used  Substance and Sexual Activity   Alcohol use: No   Drug use: No   Sexual activity: Yes    Partners: Male    Birth control/protection: Condom  Other Topics Concern   Not on file  Social History Narrative   Not on file   Social Drivers of Health   Financial Resource Strain: Patient Declined (03/05/2023)   Overall Financial Resource Strain (CARDIA)    Difficulty of Paying Living Expenses: Patient declined  Food Insecurity: No Food Insecurity (03/05/2023)   Hunger Vital Sign    Worried About Running Out of Food in the Last Year: Never true    Ran Out of Food in the Last Year: Never true  Transportation Needs: No Transportation Needs (03/05/2023)   PRAPARE - Administrator, Civil Service (Medical): No    Lack of Transportation (Non-Medical): No  Physical Activity: Insufficiently Active (03/05/2023)   Exercise Vital Sign    Days of Exercise per Week: 1 day    Minutes of Exercise per Session: 20 min  Stress: No Stress Concern Present (03/05/2023)   [employer] of Occupational Health - Occupational Stress Questionnaire    Feeling of Stress : Only a little  Social Connections: Socially Integrated (03/05/2023)   Social Connection and Isolation Panel [NHANES]    Frequency of Communication with Friends and Family: More than three times a week    Frequency of Social  Gatherings with Friends and Family: More than three times a week    Attends Religious Services: More than 4 times per year    Active Member of [employer] or Organizations: Yes    Attends Engineer, structural: More than 4 times per year    Marital Status: Married  Catering manager Violence: Not At Risk (12/02/2022)   Humiliation, Afraid, Rape, and Kick questionnaire    Fear of Current or Ex-Partner: No    Emotionally Abused: No    Physically Abused: No    Sexually Abused: No    Review of Systems:    Constitutional: No weight loss, fever, chills, weakness or fatigue HEENT: Eyes: No change in vision  Ears, Nose, Throat:  No change in hearing or congestion Skin: No rash or itching Cardiovascular: No chest pain, chest pressure or palpitations   Respiratory: No SOB or cough Gastrointestinal: See HPI and otherwise negative Genitourinary: No dysuria or change in urinary frequency Neurological: No headache, dizziness or syncope Musculoskeletal: No new muscle or joint pain Hematologic: No bleeding or bruising Psychiatric: No history of depression or anxiety    Physical Exam:  Vital signs: BP 118/84   Pulse 96   Ht 5\' 5"  (1.651 m)   Wt 207 lb 8 oz (94.1 kg)   BMI 34.53 kg/m   Constitutional: NAD, Well developed, Well nourished, alert and cooperative Head:  Normocephalic and atraumatic. Eyes:   PEERL, EOMI. No icterus. Conjunctiva pink. Respiratory: Respirations even and unlabored. Lungs clear to auscultation bilaterally.   No wheezes, crackles, or rhonchi.  Cardiovascular:  Regular rate and rhythm. No peripheral edema, cyanosis or pallor.  Gastrointestinal:  Soft, mild ruq tenderness, nontender. No rebound or guarding. Normal bowel sounds. No appreciable masses or hepatomegaly. Rectal:  Not performed.  Msk:  Symmetrical without gross deformities. Without edema, no deformity or joint abnormality.  Neurologic:  Alert and  oriented x4;  grossly normal neurologically.   Skin:   Dry and intact without significant lesions or rashes. Psychiatric: Oriented to person, place and time. Demonstrates good judgement and reason without abnormal affect or behaviors.   RELEVANT LABS AND IMAGING: CBC    Component Value Date/Time   WBC 7.1 12/17/2022 0925   RBC 5.43 (H) 12/17/2022 0925   HGB 14.2 12/17/2022 0925   HGB 13.0 10/04/2018 0908   HCT 43.5 12/17/2022 0925   HCT 40.9 10/04/2018 0908   PLT 261.0 12/17/2022 0925   PLT 271 10/04/2018 0908   MCV 80.1 12/17/2022 0925   MCV 76 (L) 10/04/2018 0908   MCH 24.1 (L) 10/04/2018 0908   MCHC 32.6 12/17/2022 0925   RDW 15.7 (H) 12/17/2022 0925   RDW 15.8 (H) 10/04/2018 0908   LYMPHSABS 1.7 12/17/2022 0925   LYMPHSABS 2.1 10/04/2018 0908   MONOABS 0.5 12/17/2022 0925   EOSABS 0.1 12/17/2022 0925   EOSABS 0.1 10/04/2018 0908   BASOSABS 0.0 12/17/2022 0925   BASOSABS 0.0 10/04/2018 0908    CMP     Component Value Date/Time   NA 139 11/25/2022 0931   K 4.4 11/25/2022 0931   CL 103 11/25/2022 0931   CO2 22 11/25/2022 0931   GLUCOSE 93 11/25/2022 0931   GLUCOSE 82 08/30/2010 0955   BUN 11 11/25/2022 0931   CREATININE 0.63 11/25/2022 0931   CALCIUM 9.4 11/25/2022 0931   PROT 7.4 12/17/2022 0925   PROT 6.6 11/25/2022 0931   ALBUMIN 4.2 12/17/2022 0925   ALBUMIN 4.3 11/25/2022 0931   AST 22 12/17/2022 0925   ALT 38 (H) 12/17/2022 0925   ALKPHOS 139 (H) 12/17/2022 0925   BILITOT 0.4 12/17/2022 0925   BILITOT 0.4 11/25/2022 0931   GFRNONAA 110 10/04/2018 0908   GFRAA 127 10/04/2018 0908     Assessment/Plan:      GERD RUQ pain Epigastric pain Bloating Episodic upper abdominal discomfort, bloating, and regurgitation. Symptoms are worse with eating and improved with Gas-X. Currently on over-the-counter Pepcid with partial relief. Family history of GERD.  Symptoms likely multifactorial: DDx includes gastritis, esophagitis, PUD.  Also cannot rule out gallbladder etiology with her history of gallstones  and RUQ pain.  Patient would prefer to avoid endoscopic procedures if possible. --Start Pantoprazole 40mg  daily 30 minutes  before breakfast. --Continue Pepcid at bedtime. - Educated patient on lifestyle modifications and provided patient education handout --Avoid trigger foods and do not lie down immediately after eating. --Check in 1-2 weeks to assess response to treatment. --Consider endoscopy if symptoms persist despite treatment. - Follow-up 6 to 8 weeks  Gallbladder Disease History of gallstones. Episodic right upper quadrant discomfort, bloating, and nausea. Symptoms may be related to fatty food intake. --Continue monitoring symptoms. 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  Ultrasound showing hepatic steatosis. --Draw liver enzymes and iron studies today.  As well as CBC.    Colon cancer screening Negative Cologuard 2024  Boone Master, PA-C [facility] 04/16/2023, 2:10 PM  Cc: Melida Quitter, PA

## 2023-04-16 NOTE — Patient Instructions (Signed)
Your provider has requested that you go to the basement level for lab work before leaving today. Press "B" on the elevator. The lab is located at the first door on the left as you exit the elevator.  We have sent the following medications to your pharmacy for you to pick up at your convenience: pantoprazole.   _______________________________________________________  If your blood pressure at your visit was 140/90 or greater, please contact your primary care physician to follow up on this.  _______________________________________________________  If you are age 34 or older, your body mass index should be between 23-30. Your Body mass index is 34.53 kg/m. If this is out of the aforementioned range listed, please consider follow up with your Primary Care Provider.  If you are age 44 or younger, your body mass index should be between 19-25. Your Body mass index is 34.53 kg/m. If this is out of the aformentioned range listed, please consider follow up with your Primary Care Provider.   ________________________________________________________  The [facility] providers would like to encourage you to use [facility] to communicate with providers for non-urgent requests or questions.  Due to long hold times on the telephone, sending your provider a message by [facility] may be a faster and more efficient way to get a response.  Please allow 48 business hours for a response.  Please remember that this is for non-urgent requests.  _______________________________________________________

## 2023-04-20 ENCOUNTER — Other Ambulatory Visit: Payer: Self-pay | Admitting: *Deleted

## 2023-04-20 ENCOUNTER — Telehealth: Payer: Self-pay | Admitting: Gastroenterology

## 2023-04-20 DIAGNOSIS — R7989 Other specified abnormal findings of blood chemistry: Secondary | ICD-10-CM

## 2023-04-20 DIAGNOSIS — K7581 Nonalcoholic steatohepatitis (NASH): Secondary | ICD-10-CM

## 2023-04-20 NOTE — Telephone Encounter (Signed)
**Note De-Identified Cataldi Obfuscation** Inbound call from patient returning phone call regarding recent lab results. Please advise, thank you.

## 2023-04-20 NOTE — Telephone Encounter (Signed)
See 04/16/23 lab result note.

## 2023-04-24 ENCOUNTER — Telehealth: Payer: Self-pay | Admitting: *Deleted

## 2023-04-24 DIAGNOSIS — R7989 Other specified abnormal findings of blood chemistry: Secondary | ICD-10-CM

## 2023-04-24 NOTE — Progress Notes (Signed)
____________________________________________________________  Attending physician addendum:  Thank you for sending this case to me. I have reviewed the entire note and agree with the plan, with the following addition:  Bayley, though the symptoms are somewhat atypical with description of bloating and pressure, I am concerned that they may be biliary in nature.  She has known gallstones and, despite a normal caliber CBD on the ultrasound, her LFTs are back up again so she could have choledocholithiasis as well.  My opinion is she needs an MRCP.  Please make the arrangements and let me know when you have the report.  Amada Jupiter, MD  ____________________________________________________________

## 2023-04-24 NOTE — Telephone Encounter (Signed)
I have left a voicemail for patient to call back. 

## 2023-04-24 NOTE — Telephone Encounter (Signed)
Patient has been scheduled for MRCP at [facility] on Sunday, 04/26/23 at 12:00 pm, 11:45 am arrival. She should present to the ER admissions team for registration.  Insurance approval auth# [id number] good from 2/21-8/20/25   Patient has been advised of this information and verbalizes understanding.

## 2023-04-24 NOTE — Telephone Encounter (Addendum)
-----   Message from Legrand Como sent at 04/24/2023  9:32 AM EST ----- Please set up for MRCP with dx code elevate lfts, gallstones, concern for CBD stone ----- Message ----- From: Sherrilyn Rist, MD Sent: 04/24/2023   6:21 AM EST To: Legrand Como, PA-C    Danis, Andreas Blower, MD at 04/16/2023  1:30 PM  Status: Signed  ____________________________________________________________   Attending physician addendum:   Thank you for sending this case to me. I have reviewed the entire note and agree with the plan, with the following addition:   Bayley, though the symptoms are somewhat atypical with description of bloating and pressure, I am concerned that they may be biliary in nature.  She has known gallstones and, despite a normal caliber CBD on the ultrasound, her LFTs are back up again so she could have choledocholithiasis as well.  My opinion is she needs an MRCP.  Please make the arrangements and let me know when you have the report.   Amada Jupiter, MD

## 2023-04-26 ENCOUNTER — Other Ambulatory Visit: Payer: Self-pay | Admitting: Gastroenterology

## 2023-04-26 ENCOUNTER — Ambulatory Visit (HOSPITAL_COMMUNITY)
Admission: RE | Admit: 2023-04-26 | Discharge: 2023-04-26 | Disposition: A | Discharge status: Home / Self Care | Payer: No Typology Code available for payment source | Source: Ambulatory Visit | Attending: Gastroenterology | Admitting: Gastroenterology

## 2023-04-26 DIAGNOSIS — R7989 Other specified abnormal findings of blood chemistry: Secondary | ICD-10-CM | POA: Diagnosis present

## 2023-04-26 MED ORDER — GADOBUTROL 1 MMOL/ML IV SOLN
9.0000 mL | Freq: Once | INTRAVENOUS | Status: AC | PRN
Start: 2023-04-26 — End: 2023-04-26
  Administered 2023-04-26: 9 mL via INTRAVENOUS

## 2023-05-11 ENCOUNTER — Telehealth: Payer: Self-pay | Admitting: *Deleted

## 2023-05-11 ENCOUNTER — Other Ambulatory Visit: Payer: Self-pay | Admitting: *Deleted

## 2023-05-11 DIAGNOSIS — R1011 Right upper quadrant pain: Secondary | ICD-10-CM

## 2023-05-11 DIAGNOSIS — R7989 Other specified abnormal findings of blood chemistry: Secondary | ICD-10-CM

## 2023-05-11 DIAGNOSIS — R1013 Epigastric pain: Secondary | ICD-10-CM

## 2023-05-11 NOTE — Telephone Encounter (Signed)
 Patient has been scheduled for CT abdomen with pancreatic protocol at [facility] on 05/22/23 at 12:30 pm, 12:15 pm arrival, NPO 4 hours prior.  Patient also has a scheduled follow up with Dr Myrtie Neither on 05/19/23 at 4 pm (which she has already been made aware of).  She is due for labwork as well.  Patient is currently on a missions trip to [place] and will not return until next week. I will send a mychart message with the above information.

## 2023-05-11 NOTE — Telephone Encounter (Signed)
-----   Message from Nurse Deno Etienne sent at 04/27/2023 12:22 PM EST ----- Bayley pt; needs ct pancreatic protocol around 05/20/23; see 2/23 mrcp result notes; place order and call pt;  Also remind her of upcoming appt with bayley 06/2023

## 2023-05-15 ENCOUNTER — Other Ambulatory Visit

## 2023-05-15 DIAGNOSIS — R7989 Other specified abnormal findings of blood chemistry: Secondary | ICD-10-CM | POA: Diagnosis not present

## 2023-05-15 DIAGNOSIS — K7581 Nonalcoholic steatohepatitis (NASH): Secondary | ICD-10-CM

## 2023-05-15 LAB — HEPATIC FUNCTION PANEL
ALT: 37 U/L — ABNORMAL HIGH (ref 0–35)
AST: 26 U/L (ref 0–37)
Albumin: 4.4 g/dL (ref 3.5–5.2)
Alkaline Phosphatase: 111 U/L (ref 39–117)
Bilirubin, Direct: 0.1 mg/dL (ref 0.0–0.3)
Total Bilirubin: 0.3 mg/dL (ref 0.2–1.2)
Total Protein: 7.1 g/dL (ref 6.0–8.3)

## 2023-05-19 ENCOUNTER — Ambulatory Visit (INDEPENDENT_AMBULATORY_CARE_PROVIDER_SITE_OTHER): Payer: No Typology Code available for payment source | Admitting: Gastroenterology

## 2023-05-19 ENCOUNTER — Encounter: Payer: Self-pay | Admitting: Gastroenterology

## 2023-05-19 VITALS — BP 120/70 | HR 89 | Ht 64.25 in | Wt 202.0 lb

## 2023-05-19 DIAGNOSIS — R933 Abnormal findings on diagnostic imaging of other parts of digestive tract: Secondary | ICD-10-CM

## 2023-05-19 DIAGNOSIS — K851 Biliary acute pancreatitis without necrosis or infection: Secondary | ICD-10-CM | POA: Diagnosis not present

## 2023-05-19 DIAGNOSIS — R1011 Right upper quadrant pain: Secondary | ICD-10-CM

## 2023-05-19 DIAGNOSIS — K8020 Calculus of gallbladder without cholecystitis without obstruction: Secondary | ICD-10-CM

## 2023-05-19 DIAGNOSIS — R1013 Epigastric pain: Secondary | ICD-10-CM | POA: Diagnosis not present

## 2023-05-19 NOTE — Progress Notes (Signed)
 [facility] Progress Note  Chief Complaint: Epigastric pain, abnormal imaging findings, gallstones  Subjective  Prior history  Seen by APP in this office October 2016 for constipation and elevated LFTs as well as gallstones without abdominal pain.  Ultrasound with hepatic echogenicity suggesting possible fatty liver.  Additional serologic testing done for underlying liver disease. Negative Cologuard October 2024 Seen by APP 04/16/2023 with LFTs that had increased and she was experiencing lower chest upper abdominal episodic pain with little improvement taking famotidine.  _____________  Nedra Hai is here today for follow-up of her symptoms and recently returned from a mission trip.  She is glad to report that her dyspeptic symptoms are significantly improved including heartburn belching in the chest/rectal quadrant pain she was experiencing.  She attributes this to some changes in diet and acid suppression medicine prescribed at the recent visit. She is aware of the improved liver labs noted below and the MRI findings of gallstones and pancreatic inflammation.  A CT scan of the pancreas is scheduled for the end of this week.  A surgical referral was sent for consideration of cholecystectomy due to the gallstones as the underlying cause of these symptoms, but she has not yet set the appointment because she had been away and also her mother is having surgery soon. Illene is familiar with gallbladder issues since multiple family members have had similar problems.   ROS: Cardiovascular:  no chest pain Respiratory: no dyspnea Seasonal allergies Remainder systems negative except as above The patient's Past Medical, Family and Social History were reviewed and are on file in the EMR. Past Medical History:  Diagnosis Date   Diabetes (HCC)    Hyperlipidemia     Past Surgical History:  Procedure Laterality Date   MYRINGOTOMY WITH TUBE PLACEMENT     TONSILLECTOMY AND ADENOIDECTOMY        Objective:  Med list reviewed  Current Outpatient Medications:    acetaminophen (TYLENOL) 500 MG tablet, Take 500 mg by mouth every 6 (six) hours as needed., Disp: , Rfl:    cetirizine (ZYRTEC) 5 MG chewable tablet, Chew 5 mg by mouth as needed for allergies., Disp: , Rfl:    Cholecalciferol (VITAMIN D-3) 25 MCG (1000 UT) CAPS, Take 2 capsules by mouth daily., Disp: , Rfl:    famotidine (PEPCID) 20 MG tablet, Take 20 mg by mouth daily., Disp: , Rfl:    meloxicam (MOBIC) 7.5 MG tablet, Take 7.5 mg by mouth as needed for pain (rarely uses)., Disp: , Rfl:    Multiple Vitamins-Minerals (MULTIVITAMIN WITH MINERALS) tablet, Take 1 tablet by mouth daily., Disp: , Rfl:    pantoprazole (PROTONIX) 40 MG tablet, Take 1 tablet (40 mg total) by mouth daily., Disp: 30 tablet, Rfl: 2   Vital signs in last 24 hrs: Vitals:   05/19/23 1555  BP: 120/70  Pulse: 89   Wt Readings from Last 3 Encounters:  05/19/23 202 lb (91.6 kg)  04/16/23 207 lb 8 oz (94.1 kg)  03/05/23 212 lb (96.2 kg)    Physical Exam  Well-appearing HEENT: sclera anicteric, oral mucosa moist without lesions Neck: supple, no thyromegaly, JVD or lymphadenopathy Cardiac: Regular without appreciable murmur,  no peripheral edema Pulm: clear to auscultation bilaterally, normal RR and effort noted Abdomen: soft, no focal tenderness, with active bowel sounds. No guarding or palpable hepatosplenomegaly. Skin; warm and dry, no jaundice or rash   Labs:     Latest Ref Rng & Units 04/16/2023    2:10 PM 12/17/2022  9:25 AM 10/04/2018    9:08 AM  CBC  WBC 4.0 - 10.5 K/uL 8.8  7.1  9.2   Hemoglobin 12.0 - 15.0 g/dL 40.9  81.1  91.4   Hematocrit 36.0 - 46.0 % 41.7  43.5  40.9   Platelets 150.0 - 400.0 K/uL 250.0  261.0  271       Latest Ref Rng & Units 05/15/2023   11:01 AM 04/16/2023    2:10 PM 12/17/2022    9:25 AM  CMP  Glucose 70 - 99 mg/dL  782    BUN 6 - 23 mg/dL  12    Creatinine 9.56 - 1.20 mg/dL  2.13    Sodium  086 - 145 mEq/L  139    Potassium 3.5 - 5.1 mEq/L  3.8    Chloride 96 - 112 mEq/L  103    CO2 19 - 32 mEq/L  27    Calcium 8.4 - 10.5 mg/dL  9.4    Total Protein 6.0 - 8.3 g/dL 7.1  7.3  7.4   Total Bilirubin 0.2 - 1.2 mg/dL 0.3  0.5  0.4   Alkaline Phos 39 - 117 U/L 111  292  139   AST 0 - 37 U/L 26  38  22   ALT 0 - 35 U/L 37  228  38     ___________________________________________ Radiologic studies:  CLINICAL DATA:  Elevated LFTs, history of gallstones   EXAM: MRI ABDOMEN WITHOUT AND WITH CONTRAST (INCLUDING MRCP)   TECHNIQUE: Multiplanar multisequence MR imaging of the abdomen was performed both before and after the administration of intravenous contrast. Heavily T2-weighted images of the biliary and pancreatic ducts were obtained, and three-dimensional MRCP images were rendered by post processing.   CONTRAST:  9mL GADAVIST GADOBUTROL 1 MMOL/ML IV SOLN   COMPARISON:  Right upper quadrant ultrasound, 12/08/2022   FINDINGS: Lower chest: No acute abnormality.   Hepatobiliary: No solid liver abnormality is seen. Hepatic steatosis. Hepatomegaly, maximum coronal span 21.7 cm. Numerous tiny gallstones. No gallbladder wall thickening no biliary ductal dilatation.   Pancreas: Fullness of the central pancreatic head without abnormal diffusion restriction or contrast enhancement, measuring 4.1 x 2.9 cm (series 22, image 27). The pancreatic and common bile ducts are patent and nondilated through the vicinity (series 3, image 16). No pancreatic ductal dilatation. Minimal inflammatory fat stranding about the pancreatic head and adjacent retroperitoneum (series 4, image 25).   Spleen: Normal in size without significant abnormality.   Adrenals/Urinary Tract: Adrenal glands are unremarkable. Kidneys are normal, without renal calculi, solid lesion, or hydronephrosis.   Stomach/Bowel: Stomach is within normal limits. No evidence of bowel wall thickening, distention, or  inflammatory changes.   Vascular/Lymphatic: No significant vascular findings are present. No enlarged abdominal lymph nodes.   Other: No abdominal wall hernia or abnormality. No ascites.   Musculoskeletal: No acute or significant osseous findings.   IMPRESSION: 1. Fullness of the central pancreatic head without abnormal diffusion restriction or contrast enhancement, measuring 4.1 x 2.9 cm. The pancreatic and common bile ducts are patent and nondilated through the vicinity. No pancreatic ductal dilatation. Minimal inflammatory fat stranding about the pancreatic head and adjacent retroperitoneum. Findings are of uncertain significance, suggesting acute pancreatitis or subacute sequelae. Underlying mass difficult to exclude however no definitive evidence. Consider EUS or short interval imaging follow-up. Comparison to prior CT imaging of the abdomen, if available, would be helpful to assess for interval change in appearance of the pancreatic head. 2. Cholelithiasis. No gallbladder  wall thickening or biliary ductal dilatation. 3. Hepatomegaly and hepatic steatosis.   These results will be called to the ordering clinician or representative by the Radiologist Assistant, and communication documented in the PACS or [employer].     Electronically Signed   By: Jearld Lesch M.D.   On: 04/26/2023 16:47   ____________________________________________ Other:   _____________________________________________   Encounter Diagnoses  Name Primary?   Gallstones Yes   Acute biliary pancreatitis without infection or necrosis    RUQ pain    Abnormal finding on GI tract imaging     Assessment and Plan While some of the symptoms could be reflux in nature, I believe that biliary colic has been the main thing going on here.  She had marked elevation of her LFTs suggesting she at least had CBD stone/s that caused acute pancreatitis, sequelae of which she is seen on the recent MRCP.  The  pancreatic findings are less likely to be neoplastic given the overall clinical picture with gallstones and elevated LFTs and lack of pancreatic ductal dilatation. No reported biliary ductal dilatation and radiology did not describe choledocholithiasis.  However, it should be noted that in the setting of inflamed pancreatic head, small stones such as are present in the gallbladder might not be visualized on an MRCP.  So it is still possible she has persistent nonobstructing choledocholithiasis that needs to be further investigated prior to cholecystectomy.  Again, it is fortunate that she is feeling so much better and her liver labs are much improved.  Plan: Pancreatic protocol CT scan scheduled for later this week.  Once we have the reports, I will communicate with our advanced endoscopist Dr. Meridee Score because I think this patient may still need an EUS +/- ERCP if there is clinical suspicion for CBD stones.  30 minutes were spent on this encounter (including chart review, history/exam, counseling/coordination of care, and documentation) > 50% of that time was spent on counseling and coordination of care.   Charlie Pitter III

## 2023-05-22 ENCOUNTER — Ambulatory Visit (HOSPITAL_COMMUNITY)
Admission: RE | Admit: 2023-05-22 | Discharge: 2023-05-22 | Disposition: A | Discharge status: Home / Self Care | Source: Ambulatory Visit | Attending: Gastroenterology | Admitting: Gastroenterology

## 2023-05-22 DIAGNOSIS — R1013 Epigastric pain: Secondary | ICD-10-CM | POA: Insufficient documentation

## 2023-05-22 DIAGNOSIS — R1011 Right upper quadrant pain: Secondary | ICD-10-CM | POA: Insufficient documentation

## 2023-05-22 DIAGNOSIS — R7989 Other specified abnormal findings of blood chemistry: Secondary | ICD-10-CM | POA: Diagnosis present

## 2023-05-22 MED ORDER — IOHEXOL 300 MG/ML  SOLN
100.0000 mL | Freq: Once | INTRAMUSCULAR | Status: AC | PRN
  Administered 2023-05-22: 100 mL via INTRAVENOUS

## 2023-05-28 LAB — HM PAP SMEAR: HPV, high-risk: NEGATIVE

## 2023-05-29 ENCOUNTER — Ambulatory Visit: Payer: No Typology Code available for payment source | Admitting: Physician Assistant

## 2023-06-03 ENCOUNTER — Ambulatory Visit: Payer: No Typology Code available for payment source | Admitting: Gastroenterology

## 2023-06-03 ENCOUNTER — Other Ambulatory Visit: Payer: Self-pay | Admitting: Obstetrics and Gynecology

## 2023-06-03 DIAGNOSIS — N6310 Unspecified lump in the right breast, unspecified quadrant: Secondary | ICD-10-CM

## 2023-06-04 ENCOUNTER — Telehealth: Payer: Self-pay

## 2023-06-04 NOTE — Telephone Encounter (Signed)
 Patient returned your call.

## 2023-06-04 NOTE — Telephone Encounter (Signed)
 Received call report from radiology, see below:  IMPRESSION: Very mild peripancreatic stranding, raising the possibility of mild acute pancreatitis, although equivocal. Correlate with laboratory evaluation.   Cholelithiasis, without associated inflammatory changes.   1.9 cm enhancing polypoid lesion along the lesser curvature of the stomach, raising the possibility of gastric GIST. Endoscopy is suggested for further evaluation. These results will be called to the ordering clinician or representative by the Radiologist Assistant, and communication documented in the PACS or [employer].   Mild hepatic steatosis.

## 2023-06-05 NOTE — Telephone Encounter (Signed)
Patient retuning your call.

## 2023-06-09 ENCOUNTER — Ambulatory Visit
Admission: RE | Admit: 2023-06-09 | Discharge: 2023-06-09 | Disposition: A | Discharge status: Home / Self Care | Source: Ambulatory Visit | Attending: Obstetrics and Gynecology | Admitting: Obstetrics and Gynecology

## 2023-06-09 ENCOUNTER — Other Ambulatory Visit: Payer: Self-pay | Admitting: Obstetrics and Gynecology

## 2023-06-09 DIAGNOSIS — R599 Enlarged lymph nodes, unspecified: Secondary | ICD-10-CM | POA: Diagnosis present

## 2023-06-09 DIAGNOSIS — N6310 Unspecified lump in the right breast, unspecified quadrant: Secondary | ICD-10-CM | POA: Insufficient documentation

## 2023-06-10 ENCOUNTER — Telehealth: Payer: Self-pay | Admitting: Gastroenterology

## 2023-06-10 NOTE — Telephone Encounter (Signed)
 Inbound call from patient returning phone call regarding recent results. Please advise, thank you.

## 2023-06-10 NOTE — Telephone Encounter (Signed)
 See alternate note

## 2023-06-13 ENCOUNTER — Other Ambulatory Visit: Payer: Self-pay | Admitting: Gastroenterology

## 2023-06-19 ENCOUNTER — Other Ambulatory Visit

## 2023-06-19 ENCOUNTER — Encounter

## 2023-06-24 ENCOUNTER — Other Ambulatory Visit: Payer: Self-pay

## 2023-06-24 DIAGNOSIS — K8020 Calculus of gallbladder without cholecystitis without obstruction: Secondary | ICD-10-CM

## 2023-06-26 ENCOUNTER — Other Ambulatory Visit: Payer: No Typology Code available for payment source

## 2023-07-03 ENCOUNTER — Ambulatory Visit: Payer: No Typology Code available for payment source | Admitting: Family Medicine

## 2023-07-29 ENCOUNTER — Telehealth: Payer: Self-pay | Admitting: Gastroenterology

## 2023-07-29 ENCOUNTER — Encounter (HOSPITAL_COMMUNITY): Payer: Self-pay | Admitting: Gastroenterology

## 2023-07-29 NOTE — Progress Notes (Signed)
 Patient called regarding pre op for endo procedure 6/2. She reported she just got covid last week and tested positive 5/21 and then tested again Friday and was positive, still having symptoms this weekend. I told her hospital poilcy is she'll need to be 4 weeks from diagnosis or 2 weeks without any cold symptoms. I informed her I would notify the office but she would need to officially cancel/reschedule through the office, confirmed she had the number. Cancelling for 6/2.

## 2023-07-29 NOTE — Progress Notes (Signed)
 Attempted to obtain medical history for pre op call via telephone, unable to reach at this time. HIPAA compliant voicemail message left requesting return call to pre surgical testing department.

## 2023-07-29 NOTE — Telephone Encounter (Signed)
 Appt has been moved to 10/08/23 at 8 am at [facility] with GM.    She has been re instructed with new date and time.

## 2023-07-29 NOTE — Telephone Encounter (Signed)
 Patient called and stated that she tested positive for covid. Patient is requesting a call back to get her procedure rescheduled. Patient is scheduled for June the 2nd at the [facility]. Please advise.

## 2023-08-03 ENCOUNTER — Ambulatory Visit (HOSPITAL_COMMUNITY): Admit: 2023-08-03 | Admitting: Gastroenterology

## 2023-08-03 SURGERY — ULTRASOUND, UPPER GI TRACT, ENDOSCOPIC
Anesthesia: Monitor Anesthesia Care

## 2023-09-11 ENCOUNTER — Telehealth: Payer: Self-pay | Admitting: Gastroenterology

## 2023-09-11 ENCOUNTER — Other Ambulatory Visit: Payer: Self-pay | Admitting: Gastroenterology

## 2023-09-11 NOTE — Telephone Encounter (Signed)
 Patient called and stated that she is needing a refill on her pantoprazole . Patient is requesting that we sent that medication over to [facility] on [address]. Patient is also requesting a call back to inform her that the medication has been sent over to her pharmacy. Please advise.

## 2023-09-11 NOTE — Telephone Encounter (Signed)
 Rx already sent by Madison Favre, CMA. Pt informed and she has no further questions or concerns at this time.

## 2023-09-30 ENCOUNTER — Encounter (HOSPITAL_COMMUNITY): Payer: Self-pay | Admitting: Gastroenterology

## 2023-09-30 ENCOUNTER — Telehealth: Payer: Self-pay | Admitting: Gastroenterology

## 2023-09-30 NOTE — Telephone Encounter (Signed)
 Procedure:Upper EUS Procedure date: 10/08/23 Procedure location: [facility] Arrival Time: 6:30 am Spoke with the patient Y/N:   No, I left a detailed message on [phone] on 09/30/23 @ 10:35 am for the patient to return call  Yes, 09/30/23 @ 10:48 am  Any prep concerns? No Has the patient obtained the prep from the pharmacy ? No prep needed Do you have a care partner and transportation: Yes Any additional concerns? No

## 2023-09-30 NOTE — Telephone Encounter (Signed)
 Inbound call from patient stating she had a missed call from [facility], patient is requesting a call back. Please advise.

## 2023-09-30 NOTE — Progress Notes (Signed)
 Attempted to obtain medical history for pre op call via telephone, unable to reach at this time. HIPAA compliant voicemail message left requesting return call to pre surgical testing department.

## 2023-10-07 NOTE — Anesthesia Preprocedure Evaluation (Signed)
 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 Dental  (+) Dental Advisory Given   Pulmonary neg pulmonary ROS   Pulmonary exam normal breath sounds clear to auscultation       Cardiovascular hypertension,  Rhythm:Regular Rate:Tachycardia     Neuro/Psych negative neurological ROS     GI/Hepatic Neg liver ROS,GERD  Medicated and Controlled,,  Endo/Other  diabetes    Renal/GU negative Renal ROS     Musculoskeletal negative musculoskeletal ROS (+)    Abdominal  (+) + obese  Peds  Hematology negative hematology ROS (+)   Anesthesia Other Findings   Reproductive/Obstetrics                              Anesthesia Physical Anesthesia Plan  ASA: 2  Anesthesia Plan: MAC   Post-op Pain Management: Minimal or no pain anticipated   Induction: Intravenous  PONV Risk Score and Plan: 2 and Propofol  infusion, TIVA and Treatment may vary due to age or medical condition  Airway Management Planned:   Additional Equipment:   Intra-op Plan:   Post-operative Plan:   Informed Consent: I have reviewed the patients History and Physical, chart, labs and discussed the procedure including the risks, benefits and alternatives for the proposed anesthesia with the patient or authorized representative who has indicated his/her understanding and acceptance.     Dental advisory given  Plan Discussed with: CRNA  Anesthesia Plan Comments:          Anesthesia Quick Evaluation

## 2023-10-08 ENCOUNTER — Encounter (HOSPITAL_COMMUNITY): Payer: Self-pay | Admitting: Gastroenterology

## 2023-10-08 ENCOUNTER — Encounter (HOSPITAL_COMMUNITY): Payer: Self-pay | Admitting: Anesthesiology

## 2023-10-08 ENCOUNTER — Ambulatory Visit (HOSPITAL_COMMUNITY)
Admission: RE | Admit: 2023-10-08 | Discharge: 2023-10-08 | Disposition: A | Discharge status: Home / Self Care | Attending: Gastroenterology | Admitting: Gastroenterology

## 2023-10-08 ENCOUNTER — Ambulatory Visit (HOSPITAL_BASED_OUTPATIENT_CLINIC_OR_DEPARTMENT_OTHER): Payer: Self-pay | Admitting: Anesthesiology

## 2023-10-08 ENCOUNTER — Other Ambulatory Visit: Payer: Self-pay

## 2023-10-08 ENCOUNTER — Encounter (HOSPITAL_COMMUNITY)
Admission: RE | Disposition: A | Discharge status: Home / Self Care | Payer: Self-pay | Source: Home / Self Care | Attending: Gastroenterology

## 2023-10-08 DIAGNOSIS — K2960 Other gastritis without bleeding: Secondary | ICD-10-CM

## 2023-10-08 DIAGNOSIS — K8689 Other specified diseases of pancreas: Secondary | ICD-10-CM

## 2023-10-08 DIAGNOSIS — K219 Gastro-esophageal reflux disease without esophagitis: Secondary | ICD-10-CM | POA: Diagnosis not present

## 2023-10-08 DIAGNOSIS — R1011 Right upper quadrant pain: Secondary | ICD-10-CM | POA: Insufficient documentation

## 2023-10-08 DIAGNOSIS — K869 Disease of pancreas, unspecified: Secondary | ICD-10-CM

## 2023-10-08 DIAGNOSIS — K449 Diaphragmatic hernia without obstruction or gangrene: Secondary | ICD-10-CM

## 2023-10-08 DIAGNOSIS — Z79899 Other long term (current) drug therapy: Secondary | ICD-10-CM | POA: Diagnosis not present

## 2023-10-08 DIAGNOSIS — R933 Abnormal findings on diagnostic imaging of other parts of digestive tract: Secondary | ICD-10-CM | POA: Insufficient documentation

## 2023-10-08 DIAGNOSIS — K929 Disease of digestive system, unspecified: Secondary | ICD-10-CM | POA: Diagnosis not present

## 2023-10-08 DIAGNOSIS — E119 Type 2 diabetes mellitus without complications: Secondary | ICD-10-CM | POA: Diagnosis not present

## 2023-10-08 DIAGNOSIS — I10 Essential (primary) hypertension: Secondary | ICD-10-CM | POA: Diagnosis not present

## 2023-10-08 DIAGNOSIS — K2289 Other specified disease of esophagus: Secondary | ICD-10-CM | POA: Insufficient documentation

## 2023-10-08 DIAGNOSIS — I899 Noninfective disorder of lymphatic vessels and lymph nodes, unspecified: Secondary | ICD-10-CM | POA: Insufficient documentation

## 2023-10-08 DIAGNOSIS — D490 Neoplasm of unspecified behavior of digestive system: Secondary | ICD-10-CM | POA: Insufficient documentation

## 2023-10-08 DIAGNOSIS — C165 Malignant neoplasm of lesser curvature of stomach, unspecified: Secondary | ICD-10-CM

## 2023-10-08 DIAGNOSIS — K3189 Other diseases of stomach and duodenum: Secondary | ICD-10-CM | POA: Diagnosis not present

## 2023-10-08 DIAGNOSIS — K8020 Calculus of gallbladder without cholecystitis without obstruction: Secondary | ICD-10-CM | POA: Diagnosis not present

## 2023-10-08 DIAGNOSIS — R935 Abnormal findings on diagnostic imaging of other abdominal regions, including retroperitoneum: Secondary | ICD-10-CM

## 2023-10-08 HISTORY — PX: EUS: SHX5427

## 2023-10-08 HISTORY — PX: ESOPHAGOGASTRODUODENOSCOPY: SHX5428

## 2023-10-08 HISTORY — PX: FINE NEEDLE ASPIRATION: SHX6590

## 2023-10-08 SURGERY — EGD (ESOPHAGOGASTRODUODENOSCOPY)
Anesthesia: Monitor Anesthesia Care

## 2023-10-08 MED ORDER — SODIUM CHLORIDE 0.9 % IV SOLN: INTRAVENOUS | Status: DC

## 2023-10-08 MED ORDER — LIDOCAINE 2% (20 MG/ML) 5 ML SYRINGE
INTRAMUSCULAR | Status: DC | PRN
  Administered 2023-10-08: 60 mg via INTRAVENOUS

## 2023-10-08 MED ORDER — PROPOFOL 500 MG/50ML IV EMUL
INTRAVENOUS | Status: DC | PRN
  Administered 2023-10-08: 125 ug/kg/min via INTRAVENOUS

## 2023-10-08 MED ORDER — PROPOFOL 10 MG/ML IV BOLUS
INTRAVENOUS | Status: AC
  Filled 2023-10-08: qty 20

## 2023-10-08 MED ORDER — SPOT INK MARKER SYRINGE KIT
PACK | SUBMUCOSAL | Status: DC | PRN
  Administered 2023-10-08: 1 mL via SUBMUCOSAL

## 2023-10-08 MED ORDER — SPOT INK MARKER SYRINGE KIT
PACK | SUBMUCOSAL | Status: AC
  Filled 2023-10-08: qty 5

## 2023-10-08 MED ORDER — DEXMEDETOMIDINE HCL IN NACL 80 MCG/20ML IV SOLN
INTRAVENOUS | Status: AC
  Filled 2023-10-08: qty 20

## 2023-10-08 MED ORDER — PROPOFOL 10 MG/ML IV BOLUS
INTRAVENOUS | Status: DC | PRN
  Administered 2023-10-08 (×2): 50 mg via INTRAVENOUS
  Administered 2023-10-08: 120 mg via INTRAVENOUS
  Administered 2023-10-08: 30 mg via INTRAVENOUS
  Administered 2023-10-08 (×3): 50 mg via INTRAVENOUS

## 2023-10-08 MED ORDER — PROPOFOL 1000 MG/100ML IV EMUL
INTRAVENOUS | Status: AC
  Filled 2023-10-08: qty 100

## 2023-10-08 MED ORDER — ONDANSETRON HCL 4 MG/2ML IJ SOLN
INTRAMUSCULAR | Status: DC | PRN
  Administered 2023-10-08: 4 mg via INTRAVENOUS

## 2023-10-08 MED ORDER — PANTOPRAZOLE SODIUM 40 MG PO TBEC
40.0000 mg | DELAYED_RELEASE_TABLET | Freq: Two times a day (BID) | ORAL | 12 refills | Status: DC
Start: 2023-10-08 — End: 2023-12-10

## 2023-10-08 MED ORDER — DEXMEDETOMIDINE HCL IN NACL 80 MCG/20ML IV SOLN
INTRAVENOUS | Status: DC | PRN
Start: 2023-10-08 — End: 2023-10-08
  Administered 2023-10-08: 8 ug via INTRAVENOUS

## 2023-10-08 NOTE — Discharge Instructions (Addendum)

## 2023-10-08 NOTE — Anesthesia Procedure Notes (Signed)
 Procedure Name: MAC Date/Time: 10/08/2023 8:10 AM  Performed by: Landy Chip HERO, CRNAPre-anesthesia Checklist: Patient identified, Emergency Drugs available, Suction available, Patient being monitored and Timeout performed Patient Re-evaluated:Patient Re-evaluated prior to induction Oxygen Delivery Method: Nasal cannula Preoxygenation: Pre-oxygenation with 100% oxygen Induction Type: IV induction Placement Confirmation: positive ETCO2 Dental Injury: Teeth and Oropharynx as per pre-operative assessment

## 2023-10-08 NOTE — Op Note (Signed)
 [facility] Patient Name: Sarah Fernandez Procedure Date: 10/08/2023 MRN: [id number] Attending MD: Aloha Finner , MD, [id number] Date of Birth: Jun 28, 1975 CSN: [id number] Age: 48 Admit Type: Ambulatory Procedure:                Upper EUS Indications:              Suspected mass in stomach on CT scan, Submucosal                            tumor versus extrinsic mass found on endoscopy,                            Suspected choledocholithiasis, Abdominal pain in                            the right upper quadrant Providers:                Aloha Finner, MD, Randall Lines, RN, Fairy Marina, Technician Referring MD:              Medicines:                Monitored Anesthesia Care Complications:            No immediate complications. Estimated Blood Loss:     Estimated blood loss was minimal. Procedure:                Pre-Anesthesia Assessment:                           - Prior to the procedure, a History and Physical                            was performed, and patient medications and                            allergies were reviewed. The patient's tolerance of                            previous anesthesia was also reviewed. The risks                            and benefits of the procedure and the sedation                            options and risks were discussed with the patient.                            All questions were answered, and informed consent                            was obtained. Prior Anticoagulants: The patient has  taken no anticoagulant or antiplatelet agents                            except for NSAID medication. ASA Grade Assessment:                            II - A patient with mild systemic disease. After                            reviewing the risks and benefits, the patient was                            deemed in satisfactory condition to undergo the                             procedure.                           After obtaining informed consent, the endoscope was                            passed under direct vision. Throughout the                            procedure, the patient's blood pressure, pulse, and                            oxygen saturations were monitored continuously. The                            [id number]-[id number] ([id number]) Olympus endoscope was introduced                            through the mouth, and advanced to the second part                            of duodenum. The [id number]-[id number] ([id number]) Olympus                            duodenoscope was introduced through the mouth, and                            advanced to the area of papilla. The [id number]-AL5                            ([id number]) Olympus endosonoscope was introduced                            through the mouth, and advanced to the duodenum for                            ultrasound examination from the stomach and  duodenum. The [id number] ([id number]) Olympus                            endosonoscope was introduced through the mouth, and                            advanced to the duodenum for ultrasound examination                            from the stomach and duodenum. The upper EUS was                            accomplished without difficulty. The patient                            tolerated the procedure. Scope In: Scope Out: Findings:      ENDOSCOPIC FINDING: :      No gross lesions were noted in the entire esophagus.      The Z-line was irregular and was found 37 cm from the incisors.      A 1 cm hiatal hernia was present.      A medium-sized, submucosal, non-circumferential mass/lesion with no       bleeding and no stigmata of recent bleeding was found on the lesser       curvature of the stomach. After EUS was completed, the area adjacent was       tattooed with an injection of Spot (carbon black).      Multiple localized diminutive erosions with no bleeding  and no stigmata       of recent bleeding were found in the gastric antrum.      No other gross lesions were noted in the entire examined stomach.       Biopsies were taken with a cold forceps for histology and Helicobacter       pylori testing.      No gross lesions were noted in the duodenal bulb, in the first portion       of the duodenum and in the second portion of the duodenum. Biopsies for       histology were taken with a cold forceps for evaluation of celiac       disease.      The major papilla was normal.      ENDOSONOGRAPHIC FINDING: :      A round intramural (subepithelial) lesion was found in the lesser curve       of the stomach. The lesion was homogenous. Sonographically, the lesion       appeared to originate from the muscularis propria (Layer 4). The lesion       also appeared to involve the following wall layer(s): submucosa (Layer       3). The lesion measured 21 mm (in maximum thickness). The lesion also       measured 15 mm in diameter. The outer endosonographic borders were well       defined. Fine needle biopsy was performed. Color Doppler imaging was       utilized prior to needle puncture to confirm a lack of significant       vascular structures within the needle path. Five passes were made with       the 22 gauge Acquire  biopsy needle using a transgastric approach. A       visible core of tissue was obtained. Preliminary cytologic examination       and touch preps were performed. The cellularity of the specimen was       adequate. Final cytology results are pending.      Endosonographic images of the stomach were unremarkable. No masses and       no wall thickening were identified.      There was no sign of significant endosonographic abnormality in the       common bile duct (2.2 -> 3.7 mm) and in the common hepatic duct (5.2       mm). No stones, no biliary sludge and ducts of normal caliber were       identified.      Many stones were visualized  endosonographically in the gallbladder. The       stones were round. They were hyperechoic and characterized by shadowing.      Pancreatic parenchymal abnormalities were noted in the entire pancreas.       These consisted of hyperechoic strands.      The diameter of the main pancreatic duct (MPD) measured:      - HOP 2.0 mm (head of pancreas)      - NOP 0.8 mm (neck of pancreas)      - BOP 0.6 mm (body of the pancreas)      - TOP 0.6 mm (tail of the pancreas).      Endosonographic imaging of the ampulla showed no intramural       (subepithelial) lesion.      No malignant-appearing lymph nodes were visualized in the celiac region       (level 20), perigastric region, peripancreatic region and porta hepatis       region.      Endosonographic imaging in the visualized portion of the liver showed no       mass.      The celiac region was visualized. Impression:               EGD impression:                           - No gross lesions in the entire esophagus. Z-line                            irregular, 37 cm from the incisors.                           - 1 cm hiatal hernia.                           - Gastric subepithelial mass/lesion on the lesser                            curvature of the stomach. Tattoo placed adjacent.                           - Erosive gastropathy with no bleeding and no                            stigmata of recent bleeding.                           -  No other gross lesions in the entire stomach.                            Biopsied.                           - No gross lesions in the duodenal bulb, in the                            first portion of the duodenum and in the second                            portion of the duodenum. Biopsied.                           - Normal major papilla.                           EUS impression:                           - An intramural (subepithelial) lesion was found in                            the lesser curve of the  stomach. The lesion                            appeared to originate from within the muscularis                            propria (Layer 4). Cytology results are pending.                            However, the endosonographic appearance is highly                            suspicious for a stromal cell (smooth muscle)                            neoplasm. Fine needle biopsy performed.                           - Endosonographic images of the stomach were                            unremarkable.                           - There was no sign of significant pathology in the                            common bile duct and in the common hepatic duct.                           - Many stones were visualized endosonographically  in the gallbladder.                           - Pancreatic parenchymal abnormalities consisting                            of hyperechoic strands were noted in the entire                            pancreas.                           - Main pancreatic duct (MPD) diameter was measured.                            Endosonographically, the MPD had a normal                            appearance.                           - No malignant-appearing lymph nodes were                            visualized in the celiac region (level 20),                            perigastric region, peripancreatic region and porta                            hepatis region. Moderate Sedation:      Not Applicable - Patient had care per Anesthesia. Recommendation:           - The patient will be observed post-procedure,                            until all discharge criteria are met.                           - Discharge patient to home.                           - Patient has a contact number available for                            emergencies. The signs and symptoms of potential                            delayed complications were discussed with the                             patient. Return to normal activities tomorrow.                            Written discharge instructions were provided to the  patient.                           - Resume previous diet.                           - Observe patient's clinical course.                           - Await cytology results and await path results.                           - Increase PPI to twice daily dosing to heal                            gastritis.                           - Minimize nonsteroidal medications as able.                           - The findings and recommendations were discussed                            with the patient.                           - The findings and recommendations were discussed                            with the patient's family. Procedure Code(s):        --- Professional ---                           [phone], 51, Esophagogastroduodenoscopy, flexible,                            transoral; with transendoscopic ultrasound-guided                            intramural or transmural fine needle                            aspiration/biopsy(s), (includes endoscopic                            ultrasound examination limited to the esophagus,                            stomach or duodenum, and adjacent structures)                           43239, 59, Esophagogastroduodenoscopy, flexible,                            transoral; with biopsy, single or multiple  43236, 59, Esophagogastroduodenoscopy, flexible,                            transoral; with directed submucosal injection(s),                            any substance Diagnosis Code(s):        --- Professional ---                           K22.89, Other specified disease of esophagus                           K44.9, Diaphragmatic hernia without obstruction or                            gangrene                           D49.0, Neoplasm of unspecified behavior of                             digestive system                           K31.89, Other diseases of stomach and duodenum                           K80.20, Calculus of gallbladder without                            cholecystitis without obstruction                           K86.9, Disease of pancreas, unspecified                           I89.9, Noninfective disorder of lymphatic vessels                            and lymph nodes, unspecified                           K92.9, Disease of digestive system, unspecified                           R10.11, Right upper quadrant pain                           R93.3, Abnormal findings on diagnostic imaging of                            other parts of digestive tract CPT copyright 2022 [employer]. All rights reserved. The codes documented in this report are preliminary and upon coder review may  be revised to meet current compliance requirements. Aloha Finner, MD 10/08/2023 9:27:28 AM Number of Addenda: 0

## 2023-10-08 NOTE — Anesthesia Postprocedure Evaluation (Signed)
 Anesthesia Post Note  Patient: Sarah Fernandez  Procedure(s) Performed: ULTRASOUND, UPPER GI TRACT, ENDOSCOPIC EGD (ESOPHAGOGASTRODUODENOSCOPY) FINE NEEDLE ASPIRATION     Patient location during evaluation: PACU Anesthesia Type: MAC Level of consciousness: awake and alert Pain management: pain level controlled Vital Signs Assessment: post-procedure vital signs reviewed and stable Respiratory status: spontaneous breathing Cardiovascular status: stable Anesthetic complications: no   No notable events documented.  Last Vitals:  Vitals:   10/08/23 0920 10/08/23 0930  BP: 124/88 115/84  Pulse: 97 90  Resp: 15 17  Temp:    SpO2: 100% 98%    Last Pain:  Vitals:   10/08/23 0930  TempSrc:   PainSc: 0-No pain                 Norleen Pope

## 2023-10-08 NOTE — H&P (Signed)
 GASTROENTEROLOGY PROCEDURE H&P NOTE   Primary Care Physician: Gayle Saddie FALCON, PA-C  HPI: Sarah Fernandez is a 48 y.o. female who presents for EGD/EUS to evaluate a gastric lesion and also rule out choledocholithiasis.  Past Medical History:  Diagnosis Date   Diabetes (HCC)    Hyperlipidemia    Past Surgical History:  Procedure Laterality Date   MYRINGOTOMY WITH TUBE PLACEMENT     TONSILLECTOMY AND ADENOIDECTOMY     Current Facility-Administered Medications  Medication Dose Route Frequency Provider Last Rate Last Admin   0.9 %  sodium chloride  infusion   Intravenous Continuous Mansouraty, Aloha Raddle., MD        Current Facility-Administered Medications:    0.9 %  sodium chloride  infusion, , Intravenous, Continuous, Mansouraty, Aloha Raddle., MD Allergies  Allergen Reactions   Flonase [Fluticasone  Propionate]     Burned her nose    Family History  Problem Relation Age of Onset   Diabetes Mother    Hypertension Mother    Hyperlipidemia Mother    Kidney Stones Mother    Hypertension Father    Diabetes Father    Hyperlipidemia Father    Lung cancer Father    Diabetes Sister    Heart disease Maternal Grandmother        MI   Heart disease Maternal Grandfather 27       MI   Heart attack Maternal Grandfather    Breast cancer Maternal Aunt 39   Colon cancer Maternal Aunt    Esophageal cancer Neg Hx    Social History   Socioeconomic History   Marital status: Married    Spouse name: Not on file   Number of children: 0   Years of education: Not on file   Highest education level: 12th grade  Occupational History   Not on file  Tobacco Use   Smoking status: Never    Passive exposure: Never   Smokeless tobacco: Never  Vaping Use   Vaping status: Never Used  Substance and Sexual Activity   Alcohol use: No   Drug use: Never   Sexual activity: Yes    Partners: Male    Birth control/protection: Condom  Other Topics Concern   Not on file  Social History  Narrative   Not on file   Social Drivers of Health   Financial Resource Strain: Patient Declined (03/05/2023)   Overall Financial Resource Strain (CARDIA)    Difficulty of Paying Living Expenses: Patient declined  Food Insecurity: No Food Insecurity (03/05/2023)   Hunger Vital Sign    Worried About Running Out of Food in the Last Year: Never true    Ran Out of Food in the Last Year: Never true  Transportation Needs: No Transportation Needs (03/05/2023)   PRAPARE - Administrator, Civil Service (Medical): No    Lack of Transportation (Non-Medical): No  Physical Activity: Insufficiently Active (03/05/2023)   Exercise Vital Sign    Days of Exercise per Week: 1 day    Minutes of Exercise per Session: 20 min  Stress: No Stress Concern Present (03/05/2023)   [employer] of Occupational Health - Occupational Stress Questionnaire    Feeling of Stress : Only a little  Social Connections: Socially Integrated (03/05/2023)   Social Connection and Isolation Panel    Frequency of Communication with Friends and Family: More than three times a week    Frequency of Social Gatherings with Friends and Family: More than three times a week  Attends Religious Services: More than 4 times per year    Active Member of Clubs or Organizations: Yes    Attends Banker Meetings: More than 4 times per year    Marital Status: Married  Catering manager Violence: Not At Risk (12/02/2022)   Humiliation, Afraid, Rape, and Kick questionnaire    Fear of Current or Ex-Partner: No    Emotionally Abused: No    Physically Abused: No    Sexually Abused: No    Physical Exam: Today's Vitals   10/08/23 0720  BP: (!) (P) 174/102  Pulse: (!) 117  Resp: 20  Temp: 98.5 F (36.9 C)  TempSrc: Temporal  SpO2: 99%  Weight: 93.4 kg  Height: 5' 4.25 (1.632 m)  PainSc: 0-No pain   Body mass index is 35.09 kg/m. GEN: NAD EYE: Sclerae anicteric ENT: MMM CV: Non-tachycardic GI: Soft, NT/ND NEURO:   Alert & Oriented x 3  Lab Results: No results for input(s): WBC, HGB, HCT, PLT in the last 72 hours. BMET No results for input(s): NA, K, CL, CO2, GLUCOSE, BUN, CREATININE, CALCIUM in the last 72 hours. LFT No results for input(s): PROT, ALBUMIN, AST, ALT, ALKPHOS, BILITOT, BILIDIR, IBILI in the last 72 hours. PT/INR No results for input(s): LABPROT, INR in the last 72 hours.   Impression / Plan: This is a 48 y.o.female who presents for EGD/EUS to evaluate a gastric lesion and also rule out choledocholithiasis.  The risks of an EUS including intestinal perforation, bleeding, infection, aspiration, and medication effects were discussed as was the possibility it may not give a definitive diagnosis if a biopsy is performed.  When a biopsy of the pancreas is done as part of the EUS, there is an additional risk of pancreatitis at the rate of about 1-2%.  It was explained that procedure related pancreatitis is typically mild, although it can be severe and even life threatening, which is why we do not perform random pancreatic biopsies and only biopsy a lesion/area we feel is concerning enough to warrant the risk.   The risks and benefits of endoscopic evaluation/treatment were discussed with the patient and/or family; these include but are not limited to the risk of perforation, infection, bleeding, missed lesions, lack of diagnosis, severe illness requiring hospitalization, as well as anesthesia and sedation related illnesses.  The patient's history has been reviewed, patient examined, no change in status, and deemed stable for procedure.  The patient and/or family is agreeable to proceed.    Aloha Finner, MD [facility] Office # [phone]

## 2023-10-08 NOTE — Transfer of Care (Signed)
 Immediate Anesthesia Transfer of Care Note  Patient: Sarah Fernandez  Procedure(s) Performed: ULTRASOUND, UPPER GI TRACT, ENDOSCOPIC EGD (ESOPHAGOGASTRODUODENOSCOPY) FINE NEEDLE ASPIRATION  Patient Location: PACU and Endoscopy Unit  Anesthesia Type:MAC  Level of Consciousness: drowsy  Airway & Oxygen Therapy: Patient Spontanous Breathing and Patient connected to face mask oxygen  Post-op Assessment: Report given to RN and Post -op Vital signs reviewed and stable  Post vital signs: Reviewed and stable  Last Vitals:  Vitals Value Taken Time  BP 124/88 10/08/23 09:20  Temp    Pulse 96 10/08/23 09:22  Resp 18 10/08/23 09:22  SpO2 97 % 10/08/23 09:22  Vitals shown include unfiled device data.  Last Pain:  Vitals:   10/08/23 0919  TempSrc:   PainSc: 0-No pain         Complications: No notable events documented.

## 2023-10-09 LAB — SURGICAL PATHOLOGY

## 2023-10-11 ENCOUNTER — Telehealth: Payer: Self-pay | Admitting: Nurse Practitioner

## 2023-10-11 ENCOUNTER — Encounter (HOSPITAL_COMMUNITY): Payer: Self-pay | Admitting: Gastroenterology

## 2023-10-11 ENCOUNTER — Ambulatory Visit: Payer: Self-pay | Admitting: Gastroenterology

## 2023-10-11 NOTE — Telephone Encounter (Signed)
 Patient had EUS on 8/7 for suspected stomach mass.  A subepithelial mass was found in the lesser curvature of the stomach.  FNA was performed   Patient called the answering service today with concerns about her uvula..  Following the EUS she had a sore throat with some associated odynophagia.  This has significantly improved but today her uvula is abnormal.  She describes the uvula as being white, elongated, and something hanging from the bottom of the uvula resembling tissue.  Otherwise oral cavity looks normal  She has no other concerns.  She is able to eat.  No fever.    Recommendations: Reassurance provided.  This could be uvular necrosis which is a very rare complication of upper endoscopy and is usually self-limiting.  I told the patient that I would ask Dr. Melba nurse to call her tomorrow with a condition update

## 2023-10-12 NOTE — Telephone Encounter (Signed)
 Inbound call from patient states her throat is still very sore. Requesting a call back. Please advise, thank you

## 2023-10-12 NOTE — Telephone Encounter (Signed)
 I spoke with the pt and she tells me that she has gotten a lot better and will continue to use saline/salt water rinse and call back Wed to update us  or sooner if needed.

## 2023-10-12 NOTE — Telephone Encounter (Signed)
 PG, Thanks for update. Never seen that, so hopefully it is not that, but never know.  Patty, Reach out later this week please and see how she is doing. Water saline/salt water rinse/spit to be done for now. If she is still having issues, then urgent referral to ENT should be done. Thanks. GM

## 2023-10-13 NOTE — Telephone Encounter (Signed)
 Glad to hear this. GM

## 2023-10-14 ENCOUNTER — Encounter: Payer: Self-pay | Admitting: Gastroenterology

## 2023-10-14 LAB — CYTOLOGY - NON PAP

## 2023-10-16 ENCOUNTER — Other Ambulatory Visit: Payer: Self-pay

## 2023-10-16 DIAGNOSIS — C49A0 Gastrointestinal stromal tumor, unspecified site: Secondary | ICD-10-CM

## 2023-10-16 NOTE — Progress Notes (Signed)
 CT has been ordered and sent to the schedulers  Referral to [facility] has been sent and records faxed

## 2023-10-20 ENCOUNTER — Encounter

## 2023-10-22 ENCOUNTER — Ambulatory Visit: Payer: Self-pay | Admitting: Surgery

## 2023-10-22 NOTE — H&P (View-Only) (Signed)
 REFERRING PHYSICIAN:  Aloha Wilhelmenia Raddle.* PROVIDER:  LEONOR MACARIO DAWN, MD MRN: [id number] DOB: Aug 29, 1975 DATE OF ENCOUNTER: 10/22/2023 Subjective    Chief Complaint: New Patient (Partial gastrectomy )   History of Present Illness: Sarah Fernandez is a 48 y.o. female who is seen today as an office consultation for evaluation of New Patient (Partial gastrectomy )  She has been having upper abdominal pain and bloating, and was found to have gallstones on an US  in Oct 2024. She subsequently developed an elevated alk phos to 292, for which she was referred to GI and underwent an MRCP on 2/23. This did not show any signs of biliary obstruction, but there was fullness in the head of the pancreas. She then had a CT pancreas, which showed mild nonspecific stranding in the head of the pancreas but no mass lesions, as well as an incidental 1.9cm lesion within the stomach. She was referred for an EUS, which was completed on 8/7. This showed a submucosal mass on the lesser curve of the stomach measuring 2.1cm. The pancreas had hyperechoic stranding but no mass lesions, and the main duct was normal in caliber. 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 Today she is overall feeling well. She was started on a PPI and reports that all her epigastric pain and indigestion resolved after that.   She is overall in good health aside from DM (last A1c 6.6) and HTN. She has never had any prior abdominal surgeries and does not take any blood thinners.      Review of Systems: A complete review of systems was obtained from the patient.  I have reviewed this information and discussed as appropriate with the patient.  See HPI as well for other ROS.   Medical History: Past Medical History:  Diagnosis Date  . Diabetes (CMS/HHS-HCC)   . Hyperlipidemia     There is no problem list on file for this patient.   Past Surgical History:  Procedure Laterality Date  . FINE NEEDLE ASPIRATION   10/08/2023  . SABRAEsophagogastroduodenoscopy   10/08/2023  . SABRAEUS  10/08/2023  . Myringotomy with tube placement    . TONSILLECTOMY AND ADENOIDECTOMY       Allergies  Allergen Reactions  . Fluticasone  Propionate Other (See Comments)    Burned her nose    Current Outpatient Medications on File Prior to Visit  Medication Sig Dispense Refill  . pantoprazole  (PROTONIX ) 40 MG DR tablet Take 40 mg by mouth 2 (two) times daily before meals    . acetaminophen  (TYLENOL ) 500 MG tablet Take 500 mg by mouth every 6 (six) hours as needed    . cetirizine (ZYRTEC) 5 MG chewable tablet Take 5 mg by mouth as needed    . cholecalciferol (VITAMIN D3) 1000 unit capsule Take 2,000 Units by mouth once daily    . famotidine  (PEPCID ) 20 MG tablet Take 20 mg by mouth once daily    . meloxicam (MOBIC) 7.5 MG tablet Take 7.5 mg by mouth as needed    . multivitamin with minerals tablet Take 1 tablet by mouth once daily     No current facility-administered medications on file prior to visit.    Family History  Problem Relation Age of Onset  . High blood pressure (Hypertension) Mother   . Hyperlipidemia (Elevated cholesterol) Mother   . Diabetes Mother   . Nephrolithiasis Mother   . High blood pressure (Hypertension) Father   . Hyperlipidemia (Elevated cholesterol) Father   .  Diabetes Father   . Lung cancer Father   . Diabetes Sister   . Heart disease Maternal Grandmother   . Heart disease Maternal Grandfather   . Myocardial Infarction (Heart attack) Maternal Grandfather   . Breast cancer Maternal Aunt   . Colon cancer Maternal Aunt   . Esophageal cancer Neg Hx      Social History   Tobacco Use  Smoking Status Never  . Passive exposure: Never  Smokeless Tobacco Never     Social History   Socioeconomic History  . Marital status: Married  Tobacco Use  . Smoking status: Never    Passive exposure: Never  . Smokeless tobacco: Never  Substance and Sexual Activity  . Drug use: Never   Social  Drivers of Corporate investment banker Strain: Patient Declined (03/05/2023)   Received from [facility]   Overall Financial Resource Strain (CARDIA)   . Difficulty of Paying Living Expenses: Patient declined  Food Insecurity: No Food Insecurity (03/05/2023)   Received from [facility]   Hunger Vital Sign   . Within the past 12 months, you worried that your food would run out before you got the money to buy more.: Never true   . Within the past 12 months, the food you bought just didn't last and you didn't have money to get more.: Never true  Transportation Needs: No Transportation Needs (03/05/2023)   Received from [facility] - Transportation   . Lack of Transportation (Medical): No   . Lack of Transportation (Non-Medical): No  Physical Activity: Insufficiently Active (03/05/2023)   Received from [facility]   Exercise Vital Sign   . On average, how many days per week do you engage in moderate to strenuous exercise (like a brisk walk)?: 1 day   . On average, how many minutes do you engage in exercise at this level?: 20 min  Stress: No Stress Concern Present (03/05/2023)   Received from [facility] Occupational [facility] - Occupational Stress Questionnaire   . Feeling of Stress : Only a little  Social Connections: Socially Integrated (03/05/2023)   Received from [facility]   Social Connection and Isolation Panel   . In a typical week, how many times do you talk on the phone with family, friends, or neighbors?: More than three times a week   . How often do you get together with friends or relatives?: More than three times a week   . How often do you attend church or religious services?: More than 4 times per year   . Do you belong to any clubs or organizations such as church groups, unions, fraternal or athletic groups, or school groups?: Yes   . How often do you attend meetings of the clubs or organizations you belong to?: More than 4 times per year   . Are you  married, widowed, divorced, separated, never married, or living with a partner?: Married  Housing Stability: Unknown (10/22/2023)   Housing Stability Vital Sign   . Homeless in the Last Year: No    Objective:   Vitals:   10/22/23 1537  BP: (!) 138/90  Pulse: (!) 128  Temp: 36.6 C (97.8 F)  SpO2: 97%  Weight: 94.2 kg (207 lb 9.6 oz)  Height: 165.1 cm (5' 5)  PainSc: 0-No pain    Body mass index is 34.55 kg/m.  Physical Exam Vitals reviewed.  Constitutional:      General: She is not in acute  distress.    Appearance: Normal appearance.  HENT:     Head: Normocephalic and atraumatic.  Cardiovascular:     Rate and Rhythm: Normal rate and regular rhythm.     Heart sounds: No murmur heard. Pulmonary:     Effort: Pulmonary effort is normal. No respiratory distress.  Abdominal:     General: There is no distension.     Palpations: Abdomen is soft.     Tenderness: There is no abdominal tenderness.  Skin:    General: Skin is warm and dry.     Coloration: Skin is not jaundiced.  Neurological:     General: No focal deficit present.     Mental Status: She is alert and oriented to person, place, and time.        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 I personally reviewed her labs, imaging and notes. This is resectable via a laparoscopic gastric wedge resection. I reviewed the details of this procedure, including the benefits and risks of bleeding, infection, and gastric leak. We also reviewed the anticipated recovery course and postop restrictions. She expressed understanding and consents to proceed to surgery. She will be scheduled for surgery in early September, assuming her staging chest CT (already scheduled for 8/29) is normal. All  questions were answered.   Her epigastric abdominal pain and bloating have resolved with PPI therapy, so I do not feel that cholecystectomy is indicated at this time.    SHELBY LYNN ALLEN, MD

## 2023-10-29 NOTE — Progress Notes (Signed)
 Surgical Instructions   Your procedure is scheduled on Tuesday, September 9th. Report to [facility] Main Entrance A at 5:30 A.M., then check in with the Admitting office. Any questions or running late day of surgery: call [phone]  Questions prior to your surgery date: call [phone], Monday-Friday, 8am-4pm. If you experience any cold or flu symptoms such as cough, fever, chills, shortness of breath, etc. between now and your scheduled surgery, please notify us  at the above number.     Remember:  Do not eat after midnight the night before your surgery   You may drink clear liquids until 4:30 the morning of your surgery.   Clear liquids allowed are: Water, Non-Citrus Juices (without pulp), Carbonated Beverages, Clear Tea (no milk, honey, etc.), Black Coffee Only (NO MILK, CREAM OR POWDERED CREAMER of any kind), and Gatorade.   Patient Instructions  The night before surgery:  No food after midnight. ONLY clear liquids after midnight   The day of surgery (if you have diabetes): Drink ONE (1) 12 oz G2 given to you in your pre admission testing appointment by 4:30 the morning of surgery. Drink in one sitting. Do not sip.  This drink was given to you during your hospital pre-op appointment visit.   Nothing else to drink after completing the 12 oz bottle of G2.         If you have questions, please contact your surgeon's office.    Take these medicines the morning of surgery with A SIP OF WATER  cetirizine (ZYRTEC)  ketotifen (ZADITOR) eye drops  pantoprazole  (PROTONIX )    May take these medicines IF NEEDED: acetaminophen (TYLENOL)  sodium chloride  (OCEAN) nasal spray    One week prior to surgery, STOP taking any Aspirin (unless otherwise instructed by your surgeon) Aleve, Naproxen, Ibuprofen, Motrin, Advil, Goody's, BC's, all herbal medications, fish oil, and non-prescription vitamins. This includes your meloxicam (MOBIC).  WHAT DO I DO ABOUT MY DIABETES  MEDICATION?   Do not take oral diabetes medicines (pills) the morning of surgery.  The day of surgery, do not take other diabetes injectables, including Byetta (exenatide), Bydureon (exenatide ER), Victoza (liraglutide), or Trulicity (dulaglutide).  If your CBG is greater than 220 mg/dL, you may take  of your sliding scale (correction) dose of insulin.   HOW TO MANAGE YOUR DIABETES BEFORE AND AFTER SURGERY  Why is it important to control my blood sugar before and after surgery? Improving blood sugar levels before and after surgery helps healing and can limit problems. A way of improving blood sugar control is eating a healthy diet by:  Eating less sugar and carbohydrates  Increasing activity/exercise  Talking with your doctor about reaching your blood sugar goals High blood sugars (greater than 180 mg/dL) can raise your risk of infections and slow your recovery, so you will need to focus on controlling your diabetes during the weeks before surgery. Make sure that the doctor who takes care of your diabetes knows about your planned surgery including the date and location.  How do I manage my blood sugar before surgery? Check your blood sugar at least 4 times a day, starting 2 days before surgery, to make sure that the level is not too high or low.  Check your blood sugar the morning of your surgery when you wake up and every 2 hours until you get to the Short Stay unit.  If your blood sugar is less than 70 mg/dL, you will need to treat for low blood sugar: Do not  take insulin. Treat a low blood sugar (less than 70 mg/dL) with  cup of clear juice (cranberry or apple), 4 glucose tablets, OR glucose gel. Recheck blood sugar in 15 minutes after treatment (to make sure it is greater than 70 mg/dL). If your blood sugar is not greater than 70 mg/dL on recheck, call [phone] for further instructions. Report your blood sugar to the short stay nurse when you get to Short Stay.  If you are  admitted to the hospital after surgery: Your blood sugar will be checked by the staff and you will probably be given insulin after surgery (instead of oral diabetes medicines) to make sure you have good blood sugar levels. The goal for blood sugar control after surgery is 80-180 mg/dL.                      Do NOT Smoke (Tobacco/Vaping) for 24 hours prior to your procedure.  If you use a CPAP at night, you may bring your mask/headgear for your overnight stay.   You will be asked to remove any contacts, glasses, piercing's, hearing aid's, dentures/partials prior to surgery. Please bring cases for these items if needed.    Patients discharged the day of surgery will not be allowed to drive home, and someone needs to stay with them for 24 hours.  SURGICAL WAITING ROOM VISITATION Patients may have no more than 2 support people in the waiting area - these visitors may rotate.   Pre-op nurse will coordinate an appropriate time for 1 ADULT support person, who may not rotate, to accompany patient in pre-op.  Children under the age of 38 must have an adult with them who is not the patient and must remain in the main waiting area with an adult.  If the patient needs to stay at the hospital during part of their recovery, the visitor guidelines for inpatient rooms apply.  Please refer to the [facility] website for the visitor guidelines for any additional information.   If you received a COVID test during your pre-op visit  it is requested that you wear a mask when out in public, stay away from anyone that may not be feeling well and notify your surgeon if you develop symptoms. If you have been in contact with anyone that has tested positive in the last 10 days please notify you surgeon.      Pre-operative CHG Bathing Instructions   You can play a key role in reducing the risk of infection after surgery. Your skin needs to be as free of germs as possible. You can reduce the number of germs on your  skin by washing with CHG (chlorhexidine gluconate) soap before surgery. CHG is an antiseptic soap that kills germs and continues to kill germs even after washing.   DO NOT use if you have an allergy to chlorhexidine/CHG or antibacterial soaps. If your skin becomes reddened or irritated, stop using the CHG and notify one of our RNs at [phone].              TAKE A SHOWER THE NIGHT BEFORE SURGERY AND THE DAY OF SURGERY    Please keep in mind the following:  DO NOT shave, including legs and underarms, 48 hours prior to surgery.   You may shave your face before/day of surgery.  Place clean sheets on your bed the night before surgery Use a clean washcloth (not used since being washed) for each shower. DO NOT sleep with pet's night before  surgery.  CHG Shower Instructions:  Wash your face and private area with normal soap. If you choose to wash your hair, wash first with your normal shampoo.  After you use shampoo/soap, rinse your hair and body thoroughly to remove shampoo/soap residue.  Turn the water OFF and apply half the bottle of CHG soap to a CLEAN washcloth.  Apply CHG soap ONLY FROM YOUR NECK DOWN TO YOUR TOES (washing for 3-5 minutes)  DO NOT use CHG soap on face, private areas, open wounds, or sores.  Pay special attention to the area where your surgery is being performed.  If you are having back surgery, having someone wash your back for you may be helpful. Wait 2 minutes after CHG soap is applied, then you may rinse off the CHG soap.  Pat dry with a clean towel  Put on clean pajamas    Additional instructions for the day of surgery: DO NOT APPLY any lotions, deodorants, cologne, or perfumes.   Do not wear jewelry or makeup Do not wear nail polish, gel polish, artificial nails, or any other type of covering on natural nails (fingers and toes) Do not bring valuables to the hospital. [facility] is not responsible for valuables/personal belongings. Put on clean/comfortable  clothes.  Please brush your teeth.  Ask your nurse before applying any prescription medications to the skin.

## 2023-10-30 ENCOUNTER — Encounter (HOSPITAL_COMMUNITY): Payer: Self-pay

## 2023-10-30 ENCOUNTER — Ambulatory Visit (HOSPITAL_COMMUNITY)
Admission: RE | Admit: 2023-10-30 | Discharge: 2023-10-30 | Disposition: A | Discharge status: Home / Self Care | Source: Ambulatory Visit | Attending: Gastroenterology | Admitting: Gastroenterology

## 2023-10-30 ENCOUNTER — Other Ambulatory Visit: Payer: Self-pay

## 2023-10-30 ENCOUNTER — Encounter (HOSPITAL_COMMUNITY)
Admission: RE | Admit: 2023-10-30 | Discharge: 2023-10-30 | Disposition: A | Discharge status: Home / Self Care | Source: Ambulatory Visit | Attending: Surgery | Admitting: Surgery

## 2023-10-30 VITALS — BP 135/98 | HR 80 | Temp 98.4°F | Resp 16 | Ht 65.0 in | Wt 210.0 lb

## 2023-10-30 DIAGNOSIS — K760 Fatty (change of) liver, not elsewhere classified: Secondary | ICD-10-CM | POA: Insufficient documentation

## 2023-10-30 DIAGNOSIS — R161 Splenomegaly, not elsewhere classified: Secondary | ICD-10-CM | POA: Insufficient documentation

## 2023-10-30 DIAGNOSIS — C49A2 Gastrointestinal stromal tumor of stomach: Secondary | ICD-10-CM | POA: Insufficient documentation

## 2023-10-30 DIAGNOSIS — Z01818 Encounter for other preprocedural examination: Secondary | ICD-10-CM | POA: Insufficient documentation

## 2023-10-30 DIAGNOSIS — R918 Other nonspecific abnormal finding of lung field: Secondary | ICD-10-CM | POA: Insufficient documentation

## 2023-10-30 DIAGNOSIS — E119 Type 2 diabetes mellitus without complications: Secondary | ICD-10-CM | POA: Insufficient documentation

## 2023-10-30 DIAGNOSIS — K8020 Calculus of gallbladder without cholecystitis without obstruction: Secondary | ICD-10-CM | POA: Insufficient documentation

## 2023-10-30 DIAGNOSIS — C49A0 Gastrointestinal stromal tumor, unspecified site: Secondary | ICD-10-CM | POA: Diagnosis present

## 2023-10-30 HISTORY — DX: Gastrointestinal stromal tumor, unspecified site: C49.A0

## 2023-10-30 LAB — BASIC METABOLIC PANEL WITH GFR
Anion gap: 11 (ref 5–15)
BUN: 12 mg/dL (ref 6–20)
CO2: 26 mmol/L (ref 22–32)
Calcium: 9.7 mg/dL (ref 8.9–10.3)
Chloride: 104 mmol/L (ref 98–111)
Creatinine, Ser: 0.61 mg/dL (ref 0.44–1.00)
GFR, Estimated: >60 mL/min (ref >60)
Glucose, Bld: 109 mg/dL — ABNORMAL HIGH (ref 70–99)
Potassium: 4.2 mmol/L (ref 3.5–5.1)
Sodium: 141 mmol/L (ref 135–145)

## 2023-10-30 LAB — CBC
HCT: 45.9 % (ref 36.0–46.0)
Hemoglobin: 14.5 g/dL (ref 12.0–15.0)
MCH: 26.1 pg (ref 26.0–34.0)
MCHC: 31.6 g/dL (ref 30.0–36.0)
MCV: 82.6 fL (ref 80.0–100.0)
Platelets: 245 K/uL (ref 150–400)
RBC: 5.56 MIL/uL — ABNORMAL HIGH (ref 3.87–5.11)
RDW: 15 % (ref 11.5–15.5)
WBC: 7.4 K/uL (ref 4.0–10.5)
nRBC: 0 % (ref 0.0–0.2)

## 2023-10-30 LAB — HEMOGLOBIN A1C
Hgb A1c MFr Bld: 6.4 % — ABNORMAL HIGH (ref 4.8–5.6)
Mean Plasma Glucose: 137 mg/dL

## 2023-10-30 MED ORDER — IOHEXOL 300 MG/ML  SOLN
75.0000 mL | Freq: Once | INTRAMUSCULAR | Status: AC | PRN
  Administered 2023-10-30: 75 mL via INTRAVENOUS

## 2023-10-30 NOTE — Progress Notes (Signed)
 PCP - Dr. Saddie Sacks - transitioned to her will have appt in October  Cardiologist - Denies  PPM/ICD - Denies Device Orders - n/a Rep Notified - n/a  Chest x-ray - Denies EKG - 10-30-23 Stress Test - Denies ECHO - Denies Cardiac Cath - Denies  Sleep Study - Denies CPAP - n/a  Fasting Blood Sugar - Per patient diagnosed with diabetes in January for A1C of 6.3 but does not take medicine or check blood sugars.  Last dose of GLP1 agonist-  Denies GLP1 instructions: n/a  Blood Thinner Instructions: Denies Aspirin Instructions: Denies  ERAS Protcol - Clears until 0430 PRE-SURGERY Ensure or G2- G2  COVID TEST- n/a   Anesthesia review: Yes, DM w/new EKG  Patient denies shortness of breath, fever, cough and chest pain at PAT appointment. Patient denies any respiratory issues.   All instructions explained to the patient, with a verbal understanding of the material. Patient agrees to go over the instructions while at home for a better understanding. Patient also instructed to self quarantine after being tested for COVID-19. The opportunity to ask questions was provided.

## 2023-11-03 ENCOUNTER — Encounter (HOSPITAL_COMMUNITY): Payer: Self-pay

## 2023-11-03 ENCOUNTER — Ambulatory Visit: Payer: Self-pay | Admitting: Gastroenterology

## 2023-11-03 ENCOUNTER — Other Ambulatory Visit: Payer: Self-pay

## 2023-11-03 DIAGNOSIS — C49A0 Gastrointestinal stromal tumor, unspecified site: Secondary | ICD-10-CM

## 2023-11-03 DIAGNOSIS — K851 Biliary acute pancreatitis without necrosis or infection: Secondary | ICD-10-CM

## 2023-11-03 DIAGNOSIS — R7989 Other specified abnormal findings of blood chemistry: Secondary | ICD-10-CM

## 2023-11-03 NOTE — Progress Notes (Signed)
 Anesthesia Chart Review:   Case: [id number] Date/Time: 11/10/23 0715   Procedures:      LAPAROSCOPIC PARTIAL GASTRECTOMY     EGD (ESOPHAGOGASTRODUODENOSCOPY)   Anesthesia type: General   Diagnosis: Malignant gastric stromal tumor (HCC) [C49.A2]   Pre-op diagnosis: gastric gist   [facility] ROOM 10 / [facility]   Surgeons: Dasie Leonor CROME, MD       DISCUSSION: Patient is a 48 year old female scheduled for the above procedure.  History includes never smoker, HLD, DM2 (diet controlled), gastrointestinal stromal tumor (gastric GIST 10/2023).  Evaluated by Dr. Dasie on 10/22/2023. By notes, she had been experiencing upper abdominal pain and bloating. US  in 12/2022 showed gallstones. MRCP 04/26/2023 showed fullness in the head of the pancreas. CT of the pancreas showed mild nonspecific stranding in the head of the pancreas but no mass lesions. She had an incidentally stomach lesion. EGD/EUS on 10/08/2023 showed a submucosal mass on the lesser curve of the stomach measuring 2.1cm. The pancreas had hyperechoic stranding but no mass lesions, and the main duct was normal in caliber. 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 PPI has resolved her epigastric pains. Laparoscopic gastric wedge resection planned. Since abdominal pain and bloating symptoms had resolved, cholecystectomy was not felt to be indicated.   A1c 6.4% on 10/30/2023.  Anesthesia team to evaluate on the day of surgery.   VS: BP (!) 135/98   Pulse 80   Temp 36.9 C   Resp 16   Ht 5' 5 (1.651 m)   Wt 95.3 kg   LMP 10/01/2021 (Approximate)   SpO2 99%   BMI 34.95 kg/m   PROVIDERS: Gayle Saddie FALCON, PA-C is PCP    LABS: Labs reviewed: Acceptable for surgery. LFT on 05/15/2023 showed AST 26, ALT 37, total bili 0.1, alk phos 111.  (all labs ordered are listed, but only abnormal results are displayed)  Labs Reviewed  CBC - Abnormal; Notable for the following components:      Result Value   RBC 5.56 (*)    All  other components within normal limits  BASIC METABOLIC PANEL WITH GFR - Abnormal; Notable for the following components:   Glucose, Bld 109 (*)    All other components within normal limits  HEMOGLOBIN A1C - Abnormal; Notable for the following components:   Hgb A1c MFr Bld 6.4 (*)    All other components within normal limits     IMAGES: CT Chest 10/30/2023: IMPRESSION: 1. No specific evidence of lymphadenopathy or metastatic disease in the chest. 2. Unchanged small pulmonary nodules, largest 0.5 cm of the peripheral left lung base, nonspecific but likely benign and incidental. Continued attention on follow-up. 3. Hepatic steatosis. 4. Cholelithiasis. 5. Mild splenomegaly.  CT Pancreas/Abd 06/04/2023: IMPRESSION: - Very mild peripancreatic stranding, raising the possibility of mild acute pancreatitis, although equivocal. Correlate with laboratory evaluation. - Cholelithiasis, without associated inflammatory changes. - 1.9 cm enhancing polypoid lesion along the lesser curvature of the stomach, raising the possibility of gastric GIST. Endoscopy is suggested for further evaluation. These results will be called to the ordering clinician or representative by the Radiologist Assistant, and communication documented in the PACS or [employer]. - Mild hepatic steatosis.   EKG: 10/30/2023: Normal sinus rhythm with sinus arrhythmia Normal ECG No previous ECGs available Confirmed by Nancey Scotts [phone]) on 10/30/2023 6:20:03 PM   CV: N/A  Past Medical History:  Diagnosis Date   Diabetes (HCC)    Gastrointestinal stromal tumor (GIST) (HCC)  Hyperlipidemia     Past Surgical History:  Procedure Laterality Date   ESOPHAGOGASTRODUODENOSCOPY N/A 10/08/2023   Procedure: EGD (ESOPHAGOGASTRODUODENOSCOPY);  Surgeon: Wilhelmenia Aloha Raddle., MD;  Location: [facility];  Service: Gastroenterology;  Laterality: N/A;   EUS N/A 10/08/2023   Procedure: ULTRASOUND, UPPER GI TRACT,  ENDOSCOPIC;  Surgeon: Wilhelmenia Aloha Raddle., MD;  Location: [facility];  Service: Gastroenterology;  Laterality: N/A;   FINE NEEDLE ASPIRATION  10/08/2023   Procedure: FINE NEEDLE ASPIRATION;  Surgeon: Wilhelmenia Aloha Raddle., MD;  Location: [facility];  Service: Gastroenterology;;   MYRINGOTOMY WITH TUBE PLACEMENT     TONSILLECTOMY AND ADENOIDECTOMY      MEDICATIONS:  acetaminophen (TYLENOL) 500 MG tablet   cetirizine (ZYRTEC) 10 MG tablet   Cholecalciferol (VITAMIN D-3) 25 MCG (1000 UT) CAPS   famotidine (PEPCID) 20 MG tablet   ketotifen (ZADITOR) 0.035 % ophthalmic solution   meloxicam (MOBIC) 7.5 MG tablet   Multiple Vitamins-Minerals (MULTIVITAMIN WITH MINERALS) tablet   pantoprazole  (PROTONIX ) 40 MG tablet   sodium chloride  (OCEAN) 0.65 % SOLN nasal spray   No current facility-administered medications for this encounter.    Isaiah Ruder, PA-C Surgical Short Stay/Anesthesiology [facility] Phone [phone] [facility] Phone [phone] 11/03/2023 4:10 PM

## 2023-11-03 NOTE — Anesthesia Preprocedure Evaluation (Addendum)
 Anesthesia Evaluation  Patient identified by MRN, date of birth, ID band Patient awake    Reviewed: Allergy & Precautions, NPO status , Patient's Chart, lab work & pertinent test results  Airway Mallampati: III  TM Distance: <3 FB Neck ROM: Full    Dental  (+) Teeth Intact, Dental Advisory Given,    Pulmonary neg pulmonary ROS   Pulmonary exam normal breath sounds clear to auscultation       Cardiovascular hypertension, Normal cardiovascular exam Rhythm:Regular Rate:Normal     Neuro/Psych negative neurological ROS     GI/Hepatic Neg liver ROS, PUD,GERD  Medicated and Controlled,,GIST   Endo/Other  diabetes, Type 2  Obesity   Renal/GU negative Renal ROS     Musculoskeletal negative musculoskeletal ROS (+)    Abdominal   Peds  Hematology negative hematology ROS (+)   Anesthesia Other Findings   Reproductive/Obstetrics                              Anesthesia Physical Anesthesia Plan  ASA: 3  Anesthesia Plan: General   Post-op Pain Management: Tylenol  PO (pre-op)* and Ketamine IV*   Induction: Intravenous  PONV Risk Score and Plan: 4 or greater and Midazolam , Dexamethasone  and Ondansetron   Airway Management Planned: Oral ETT and Video Laryngoscope Planned  Additional Equipment:   Intra-op Plan:   Post-operative Plan: Extubation in OR  Informed Consent: I have reviewed the patients History and Physical, chart, labs and discussed the procedure including the risks, benefits and alternatives for the proposed anesthesia with the patient or authorized representative who has indicated his/her understanding and acceptance.     Dental advisory given  Plan Discussed with: CRNA  Anesthesia Plan Comments: (PAT note written 11/03/2023 by Isaiah Ruder, PA-C.  2nd large bore PIV after induction  )         Anesthesia Quick Evaluation

## 2023-11-03 NOTE — Progress Notes (Signed)
 The pt has been advised of all information.  Lab order has been entered to be completed as able.

## 2023-11-10 ENCOUNTER — Inpatient Hospital Stay (HOSPITAL_COMMUNITY): Payer: Self-pay | Admitting: Anesthesiology

## 2023-11-10 ENCOUNTER — Encounter (HOSPITAL_COMMUNITY): Payer: Self-pay | Admitting: Surgery

## 2023-11-10 ENCOUNTER — Encounter (HOSPITAL_COMMUNITY)
Admission: RE | Disposition: A | Discharge status: Home / Self Care | Payer: Self-pay | Source: Home / Self Care | Attending: Surgery

## 2023-11-10 ENCOUNTER — Other Ambulatory Visit: Payer: Self-pay

## 2023-11-10 ENCOUNTER — Inpatient Hospital Stay (HOSPITAL_COMMUNITY)
Admission: RE | Admit: 2023-11-10 | Discharge: 2023-11-11 | DRG: 328 | Disposition: A | Discharge status: Home / Self Care | Attending: Surgery | Admitting: Surgery

## 2023-11-10 ENCOUNTER — Inpatient Hospital Stay (HOSPITAL_COMMUNITY): Payer: Self-pay | Admitting: Vascular Surgery

## 2023-11-10 DIAGNOSIS — E119 Type 2 diabetes mellitus without complications: Secondary | ICD-10-CM | POA: Diagnosis present

## 2023-11-10 DIAGNOSIS — Z801 Family history of malignant neoplasm of trachea, bronchus and lung: Secondary | ICD-10-CM

## 2023-11-10 DIAGNOSIS — Z8249 Family history of ischemic heart disease and other diseases of the circulatory system: Secondary | ICD-10-CM | POA: Diagnosis not present

## 2023-11-10 DIAGNOSIS — C49A2 Gastrointestinal stromal tumor of stomach: Secondary | ICD-10-CM

## 2023-11-10 DIAGNOSIS — I10 Essential (primary) hypertension: Secondary | ICD-10-CM | POA: Diagnosis present

## 2023-11-10 DIAGNOSIS — Z6834 Body mass index (BMI) 34.0-34.9, adult: Secondary | ICD-10-CM | POA: Diagnosis not present

## 2023-11-10 DIAGNOSIS — E785 Hyperlipidemia, unspecified: Secondary | ICD-10-CM | POA: Diagnosis present

## 2023-11-10 DIAGNOSIS — K219 Gastro-esophageal reflux disease without esophagitis: Secondary | ICD-10-CM | POA: Diagnosis present

## 2023-11-10 DIAGNOSIS — K8020 Calculus of gallbladder without cholecystitis without obstruction: Secondary | ICD-10-CM | POA: Diagnosis present

## 2023-11-10 DIAGNOSIS — E669 Obesity, unspecified: Secondary | ICD-10-CM | POA: Diagnosis present

## 2023-11-10 DIAGNOSIS — Z833 Family history of diabetes mellitus: Secondary | ICD-10-CM | POA: Diagnosis not present

## 2023-11-10 DIAGNOSIS — Z803 Family history of malignant neoplasm of breast: Secondary | ICD-10-CM | POA: Diagnosis not present

## 2023-11-10 DIAGNOSIS — Z800 Family history of malignant neoplasm of digestive organs: Secondary | ICD-10-CM | POA: Diagnosis not present

## 2023-11-10 DIAGNOSIS — Z888 Allergy status to other drugs, medicaments and biological substances status: Secondary | ICD-10-CM | POA: Diagnosis not present

## 2023-11-10 DIAGNOSIS — Z79899 Other long term (current) drug therapy: Secondary | ICD-10-CM | POA: Diagnosis not present

## 2023-11-10 DIAGNOSIS — Z01818 Encounter for other preprocedural examination: Secondary | ICD-10-CM

## 2023-11-10 HISTORY — PX: LAPAROSCOPIC PARTIAL GASTRECTOMY: SHX5908

## 2023-11-10 HISTORY — PX: ESOPHAGOGASTRODUODENOSCOPY: SHX5428

## 2023-11-10 LAB — GLUCOSE, CAPILLARY
Glucose-Capillary: 120 mg/dL — ABNORMAL HIGH (ref 70–99)
Glucose-Capillary: 189 mg/dL — ABNORMAL HIGH (ref 70–99)

## 2023-11-10 SURGERY — LAPAROSCOPIC PARTIAL GASTRECTOMY
Anesthesia: General | Site: Esophagus

## 2023-11-10 MED ORDER — ONDANSETRON HCL 4 MG/2ML IJ SOLN: 4.0000 mg | Freq: Once | INTRAMUSCULAR | Status: DC | PRN

## 2023-11-10 MED ORDER — ONDANSETRON HCL 4 MG/2ML IJ SOLN: 4.0000 mg | Freq: Four times a day (QID) | INTRAMUSCULAR | Status: DC | PRN

## 2023-11-10 MED ORDER — FENTANYL CITRATE (PF) 100 MCG/2ML IJ SOLN
INTRAMUSCULAR | Status: AC
  Filled 2023-11-10: qty 2

## 2023-11-10 MED ORDER — LACTATED RINGERS IV SOLN: INTRAVENOUS | Status: DC

## 2023-11-10 MED ORDER — PROPOFOL 10 MG/ML IV BOLUS
INTRAVENOUS | Status: DC | PRN
  Administered 2023-11-10: 40 mg via INTRAVENOUS
  Administered 2023-11-10: 160 mg via INTRAVENOUS

## 2023-11-10 MED ORDER — FENTANYL CITRATE (PF) 250 MCG/5ML IJ SOLN
INTRAMUSCULAR | Status: AC
  Filled 2023-11-10: qty 5

## 2023-11-10 MED ORDER — CEFAZOLIN SODIUM-DEXTROSE 2-4 GM/100ML-% IV SOLN
2.0000 g | INTRAVENOUS | Status: AC
Start: 2023-11-10 — End: 2023-11-10
  Administered 2023-11-10: 2 g via INTRAVENOUS
  Filled 2023-11-10: qty 100

## 2023-11-10 MED ORDER — DIPHENHYDRAMINE HCL 50 MG/ML IJ SOLN: 25.0000 mg | Freq: Four times a day (QID) | INTRAMUSCULAR | Status: DC | PRN

## 2023-11-10 MED ORDER — ACETAMINOPHEN 500 MG PO TABS
1000.0000 mg | ORAL_TABLET | Freq: Once | ORAL | Status: AC
  Administered 2023-11-10: 1000 mg via ORAL
  Filled 2023-11-10: qty 2

## 2023-11-10 MED ORDER — HYDROMORPHONE HCL 1 MG/ML IJ SOLN: 0.5000 mg | INTRAMUSCULAR | Status: DC | PRN

## 2023-11-10 MED ORDER — LIDOCAINE 2% (20 MG/ML) 5 ML SYRINGE
INTRAMUSCULAR | Status: AC
  Filled 2023-11-10: qty 5

## 2023-11-10 MED ORDER — ENSURE PRE-SURGERY PO LIQD: 592.0000 mL | Freq: Once | ORAL | Status: DC

## 2023-11-10 MED ORDER — ORAL CARE MOUTH RINSE: 15.0000 mL | Freq: Once | OROMUCOSAL | Status: AC

## 2023-11-10 MED ORDER — PROPOFOL 10 MG/ML IV BOLUS
INTRAVENOUS | Status: AC
  Filled 2023-11-10: qty 20

## 2023-11-10 MED ORDER — FENTANYL CITRATE (PF) 250 MCG/5ML IJ SOLN
INTRAMUSCULAR | Status: DC | PRN
  Administered 2023-11-10 (×2): 50 ug via INTRAVENOUS
  Administered 2023-11-10: 100 ug via INTRAVENOUS

## 2023-11-10 MED ORDER — ONDANSETRON HCL 4 MG/2ML IJ SOLN
INTRAMUSCULAR | Status: DC | PRN
  Administered 2023-11-10: 4 mg via INTRAVENOUS

## 2023-11-10 MED ORDER — PANTOPRAZOLE SODIUM 40 MG PO TBEC
40.0000 mg | DELAYED_RELEASE_TABLET | Freq: Two times a day (BID) | ORAL | Status: DC
  Administered 2023-11-10 – 2023-11-11 (×2): 40 mg via ORAL
  Filled 2023-11-10 (×2): qty 1

## 2023-11-10 MED ORDER — FENTANYL CITRATE (PF) 100 MCG/2ML IJ SOLN
25.0000 ug | INTRAMUSCULAR | Status: DC | PRN
  Administered 2023-11-10 (×2): 25 ug via INTRAVENOUS
  Administered 2023-11-10 (×2): 50 ug via INTRAVENOUS

## 2023-11-10 MED ORDER — FAMOTIDINE 20 MG PO TABS
20.0000 mg | ORAL_TABLET | Freq: Every day | ORAL | Status: DC
  Administered 2023-11-10: 20 mg via ORAL
  Filled 2023-11-10: qty 1

## 2023-11-10 MED ORDER — AMISULPRIDE (ANTIEMETIC) 5 MG/2ML IV SOLN: 10.0000 mg | Freq: Once | INTRAVENOUS | Status: DC | PRN

## 2023-11-10 MED ORDER — ROCURONIUM BROMIDE 10 MG/ML (PF) SYRINGE
PREFILLED_SYRINGE | INTRAVENOUS | Status: AC
  Filled 2023-11-10: qty 10

## 2023-11-10 MED ORDER — ONDANSETRON 4 MG PO TBDP: 4.0000 mg | ORAL_TABLET | Freq: Four times a day (QID) | ORAL | Status: DC | PRN

## 2023-11-10 MED ORDER — OXYCODONE HCL 5 MG PO TABS
5.0000 mg | ORAL_TABLET | ORAL | Status: DC | PRN
  Administered 2023-11-10: 5 mg via ORAL
  Filled 2023-11-10: qty 1

## 2023-11-10 MED ORDER — PHENYLEPHRINE HCL-NACL 20-0.9 MG/250ML-% IV SOLN
INTRAVENOUS | Status: DC | PRN
  Administered 2023-11-10: 30 ug/min via INTRAVENOUS

## 2023-11-10 MED ORDER — KETOTIFEN FUMARATE 0.035 % OP SOLN
1.0000 [drp] | Freq: Two times a day (BID) | OPHTHALMIC | Status: DC
  Filled 2023-11-10 (×2): qty 5

## 2023-11-10 MED ORDER — DEXAMETHASONE SODIUM PHOSPHATE 10 MG/ML IJ SOLN
INTRAMUSCULAR | Status: AC
  Filled 2023-11-10: qty 1

## 2023-11-10 MED ORDER — PHENYLEPHRINE 80 MCG/ML (10ML) SYRINGE FOR IV PUSH (FOR BLOOD PRESSURE SUPPORT)
PREFILLED_SYRINGE | INTRAVENOUS | Status: AC
  Filled 2023-11-10: qty 10

## 2023-11-10 MED ORDER — ACETAMINOPHEN 500 MG PO TABS
1000.0000 mg | ORAL_TABLET | Freq: Three times a day (TID) | ORAL | Status: DC
  Administered 2023-11-10 – 2023-11-11 (×3): 1000 mg via ORAL
  Filled 2023-11-10 (×3): qty 2

## 2023-11-10 MED ORDER — BUPIVACAINE-EPINEPHRINE (PF) 0.25% -1:200000 IJ SOLN
INTRAMUSCULAR | Status: AC
  Filled 2023-11-10: qty 30

## 2023-11-10 MED ORDER — ROCURONIUM BROMIDE 10 MG/ML (PF) SYRINGE
PREFILLED_SYRINGE | INTRAVENOUS | Status: DC | PRN
Start: 2023-11-10 — End: 2023-11-10
  Administered 2023-11-10: 60 mg via INTRAVENOUS
  Administered 2023-11-10: 20 mg via INTRAVENOUS

## 2023-11-10 MED ORDER — 0.9 % SODIUM CHLORIDE (POUR BTL) OPTIME
TOPICAL | Status: DC | PRN
  Administered 2023-11-10: 1000 mL

## 2023-11-10 MED ORDER — DEXAMETHASONE SODIUM PHOSPHATE 10 MG/ML IJ SOLN
INTRAMUSCULAR | Status: DC | PRN
  Administered 2023-11-10: 5 mg via INTRAVENOUS

## 2023-11-10 MED ORDER — MIDAZOLAM HCL 2 MG/2ML IJ SOLN
INTRAMUSCULAR | Status: AC
  Filled 2023-11-10: qty 2

## 2023-11-10 MED ORDER — METHOCARBAMOL 500 MG PO TABS
500.0000 mg | ORAL_TABLET | Freq: Four times a day (QID) | ORAL | Status: DC
  Administered 2023-11-10 – 2023-11-11 (×4): 500 mg via ORAL
  Filled 2023-11-10 (×4): qty 1

## 2023-11-10 MED ORDER — CHLORHEXIDINE GLUCONATE 0.12 % MT SOLN
15.0000 mL | Freq: Once | OROMUCOSAL | Status: AC
  Administered 2023-11-10: 15 mL via OROMUCOSAL
  Filled 2023-11-10: qty 15

## 2023-11-10 MED ORDER — LIDOCAINE 2% (20 MG/ML) 5 ML SYRINGE
INTRAMUSCULAR | Status: DC | PRN
  Administered 2023-11-10: 80 mg via INTRAVENOUS

## 2023-11-10 MED ORDER — MIDAZOLAM HCL 2 MG/2ML IJ SOLN
INTRAMUSCULAR | Status: DC | PRN
  Administered 2023-11-10: 2 mg via INTRAVENOUS

## 2023-11-10 MED ORDER — DIPHENHYDRAMINE HCL 25 MG PO CAPS: 25.0000 mg | ORAL_CAPSULE | Freq: Four times a day (QID) | ORAL | Status: DC | PRN

## 2023-11-10 MED ORDER — ENSURE PRE-SURGERY PO LIQD: 296.0000 mL | Freq: Once | ORAL | Status: DC

## 2023-11-10 MED ORDER — EPHEDRINE 5 MG/ML INJ
INTRAVENOUS | Status: AC
  Filled 2023-11-10: qty 5

## 2023-11-10 MED ORDER — EPHEDRINE SULFATE-NACL 50-0.9 MG/10ML-% IV SOSY
PREFILLED_SYRINGE | INTRAVENOUS | Status: DC | PRN
Start: 2023-11-10 — End: 2023-11-10
  Administered 2023-11-10: 5 mg via INTRAVENOUS

## 2023-11-10 MED ORDER — ONDANSETRON HCL 4 MG/2ML IJ SOLN
INTRAMUSCULAR | Status: AC
  Filled 2023-11-10: qty 2

## 2023-11-10 MED ORDER — SUGAMMADEX SODIUM 200 MG/2ML IV SOLN
INTRAVENOUS | Status: DC | PRN
  Administered 2023-11-10: 200 mg via INTRAVENOUS

## 2023-11-10 MED ORDER — METRONIDAZOLE 500 MG/100ML IV SOLN
500.0000 mg | INTRAVENOUS | Status: AC
  Administered 2023-11-10: 500 mg via INTRAVENOUS
  Filled 2023-11-10: qty 100

## 2023-11-10 MED ORDER — ENOXAPARIN SODIUM 40 MG/0.4ML IJ SOSY
40.0000 mg | PREFILLED_SYRINGE | INTRAMUSCULAR | Status: DC
  Administered 2023-11-11: 40 mg via SUBCUTANEOUS
  Filled 2023-11-10 (×2): qty 0.4

## 2023-11-10 MED ORDER — BUPIVACAINE-EPINEPHRINE 0.25% -1:200000 IJ SOLN
INTRAMUSCULAR | Status: DC | PRN
  Administered 2023-11-10: 30 mL

## 2023-11-10 SURGICAL SUPPLY — 35 items
BLADE SURG 10 STRL SS (BLADE) ×3 IMPLANT
CANISTER SUCTION 3000ML PPV (SUCTIONS) ×3 IMPLANT
CHLORAPREP W/TINT 26 (MISCELLANEOUS) ×3 IMPLANT
CLIP APPLIE 5 13 M/L LIGAMAX5 (MISCELLANEOUS) IMPLANT
COVER SURGICAL LIGHT HANDLE (MISCELLANEOUS) ×6 IMPLANT
DERMABOND ADVANCED .7 DNX12 (GAUZE/BANDAGES/DRESSINGS) ×3 IMPLANT
ELECTRODE REM PT RTRN 9FT ADLT (ELECTROSURGICAL) ×3 IMPLANT
GLOVE BIO SURGEON STRL SZ 6 (GLOVE) ×3 IMPLANT
GLOVE BIOGEL PI IND STRL 6 (GLOVE) ×3 IMPLANT
GLOVE INDICATOR 6.5 STRL GRN (GLOVE) ×3 IMPLANT
GOWN STRL REUS W/ TWL LRG LVL3 (GOWN DISPOSABLE) ×9 IMPLANT
GRASPER SUT TROCAR 14GX15 (MISCELLANEOUS) ×3 IMPLANT
IRRIGATION SUCT STRKRFLW 2 WTP (MISCELLANEOUS) ×3 IMPLANT
KIT BASIN OR (CUSTOM PROCEDURE TRAY) ×3 IMPLANT
NDL INSUFFLATION 14GA 120MM (NEEDLE) ×3 IMPLANT
NEEDLE INSUFFLATION 14GA 120MM (NEEDLE) ×2 IMPLANT
NS IRRIG 1000ML POUR BTL (IV SOLUTION) ×6 IMPLANT
PAD ARMBOARD POSITIONER FOAM (MISCELLANEOUS) ×6 IMPLANT
PENCIL BUTTON HOLSTER BLD 10FT (ELECTRODE) ×3 IMPLANT
RELOAD STAPLE 60 4.1 GRN THCK (STAPLE) IMPLANT
SCISSORS LAP 5X35 DISP (ENDOMECHANICALS) ×3 IMPLANT
SET TUBE SMOKE EVAC HIGH FLOW (TUBING) ×3 IMPLANT
SHEARS HARMONIC 36 ACE (MISCELLANEOUS) ×3 IMPLANT
SLEEVE Z-THREAD 5X100MM (TROCAR) ×9 IMPLANT
STAPLE ECHEON FLEX 60 POW ENDO (STAPLE) IMPLANT
SUT MNCRL AB 4-0 PS2 18 (SUTURE) ×3 IMPLANT
SUT VICRYL 0 UR6 27IN ABS (SUTURE) IMPLANT
SYR BULB IRRIG 60ML STRL (SYRINGE) ×3 IMPLANT
SYSTEM BAG RETRIEVAL 10MM (BASKET) ×3 IMPLANT
TOWEL GREEN STERILE (TOWEL DISPOSABLE) ×6 IMPLANT
TRAY LAPAROSCOPIC MC (CUSTOM PROCEDURE TRAY) ×3 IMPLANT
TROCAR Z THREAD OPTICAL 12X100 (TROCAR) ×3 IMPLANT
TROCAR Z-THREAD OPTICAL 5X100M (TROCAR) ×3 IMPLANT
WARMER LAPAROSCOPE (MISCELLANEOUS) ×3 IMPLANT
WATER STERILE IRR 1000ML POUR (IV SOLUTION) ×3 IMPLANT

## 2023-11-10 NOTE — Transfer of Care (Signed)
 Immediate Anesthesia Transfer of Care Note  Patient: Sarah Fernandez  Procedure(s) Performed: LAPAROSCOPIC PARTIAL GASTRECTOMY EGD (ESOPHAGOGASTRODUODENOSCOPY)  Patient Location: PACU  Anesthesia Type:General  Level of Consciousness: awake, alert , and oriented  Airway & Oxygen Therapy: Patient Spontanous Breathing  Post-op Assessment: Report given to RN and Post -op Vital signs reviewed and stable  Post vital signs: Reviewed and stable  Last Vitals:  Vitals Value Taken Time  BP 128/93 11/10/23 09:15  Temp 36.5 C 11/10/23 09:15  Pulse 106 11/10/23 09:19  Resp 24 11/10/23 09:19  SpO2 92 % 11/10/23 09:19  Vitals shown include unfiled device data.  Last Pain:  Vitals:   11/10/23 0614  TempSrc:   PainSc: 0-No pain      Patients Stated Pain Goal: 0 (11/10/23 0603)  Complications: No notable events documented.

## 2023-11-10 NOTE — Op Note (Signed)
 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 EUS with biopsies confirmed a GIST, and staging workup showed no evidence of metastatic disease. After a discussion of the risks and benefits of surgery, she consented to proceed.  Findings: Intramural mass on the anterior gastric body adjacent to the lesser curve.   Procedure details: Informed consent was obtained in the preoperative area prior to the procedure. The patient was brought to the operating room and placed on the table in the supine position. General anesthesia was induced and appropriate lines and drains were placed for intraoperative monitoring. Perioperative antibiotics were administered per SCIP guidelines. The abdomen was prepped and draped in the usual sterile fashion. A pre-procedure timeout was taken verifying patient identity, surgical site and procedure to be performed.  A small skin incision was made in the left upper quadrant at Palmer's point, the fascia was grasped and elevated, and a Veress needle was inserted through the fascia.  Intraperitoneal placement was confirmed with the saline drop test and the abdomen was insufflated.  A 5 mm Visiport was placed.  The peritoneal cavity was inspected with no evidence of visceral vascular injury, and there was no evidence of metastatic disease within the abdomen.  A 12 mm port was placed in the left upper quadrant to the left of midline, followed by two 5 mm ports in the right upper quadrant.  The patient was placed in  reverse Trendelenburg position.  The left lateral liver was gently elevated and there was a mass lesion present on the anterior mid body of the stomach near the lesser curve.  A Nathanson liver retractor was placed.  The lesser omentum adjacent to the tumor was opened with harmonic shears to mobilize this portion of the stomach.  The greater omentum was then opened along the mid-body of the stomach using harmonic shears.  The posterior stomach was examined and was normal.  The mass was grasped and elevated, and a stapled wedge resection of the mass was performed using a 60mm stapler with green loads.  Care was taken to ensure that the back wall of the stomach was not included in the staple loads.  The specimen was placed in an Endo Catch bag and extracted via the 12mm port site.  The specimen was examined on the back table the entire tumor appeared to have been resected intact.  An EGD was then performed by Dr. Aron, and the lumen of the stomach was wide open with no visible residual tumor along the staple line.  There was no intraluminal bleeding from the staple line.  Externally there was some oozing along the gastric staple line, which was controlled with clip placement.  The surgical site was irrigated and appeared hemostatic.  The 12 mm port site fascia was closed with a 0 Vicryl figure-of-eight suture using a PMI.  The abdomen was desufflated and the remaining ports were removed.  The skin at the port sites was closed with 4-0 Monocryl subcuticular suture.  Dermabond was applied.  The patient tolerated the procedure well with no apparent complications. All counts were  correct x2 at the end of the procedure. The patient was extubated and taken to PACU in stable condition.  Leonor Dawn, MD 11/10/23 9:16 AM

## 2023-11-10 NOTE — Interval H&P Note (Signed)
 History and Physical Interval Note:  11/10/2023 7:09 AM  Sarah Fernandez  has presented today for surgery, with the diagnosis of gastric gist.  The various methods of treatment have been discussed with the patient and family. After consideration of risks, benefits and other options for treatment, the patient has consented to  Procedure(s): LAPAROSCOPIC PARTIAL GASTRECTOMY (N/A) EGD (ESOPHAGOGASTRODUODENOSCOPY) (N/A) as a surgical intervention.  The patient's history has been reviewed, patient examined, no change in status, stable for surgery.  I have reviewed the patient's chart and labs. Staging chest CT was negative for metastatic disease.  Questions were answered to the patient's satisfaction.     Leonor LITTIE Dawn

## 2023-11-10 NOTE — Anesthesia Procedure Notes (Signed)
 Procedure Name: Intubation Date/Time: 11/10/2023 7:35 AM  Performed by: Vanice Search, RNPre-anesthesia Checklist: Patient identified, Emergency Drugs available, Suction available and Patient being monitored Patient Re-evaluated:Patient Re-evaluated prior to induction Oxygen Delivery Method: Circle System Utilized Preoxygenation: Pre-oxygenation with 100% oxygen Induction Type: IV induction Ventilation: Mask ventilation without difficulty Laryngoscope Size: Glidescope and 3 (elective glidescope) Grade View: Grade I Tube type: Oral Number of attempts: 1 Airway Equipment and Method: Stylet and Oral airway Placement Confirmation: ETT inserted through vocal cords under direct vision, positive ETCO2 and breath sounds checked- equal and bilateral Secured at: 22 cm Tube secured with: Tape Dental Injury: Teeth and Oropharynx as per pre-operative assessment

## 2023-11-11 ENCOUNTER — Encounter (HOSPITAL_COMMUNITY): Payer: Self-pay | Admitting: Surgery

## 2023-11-11 LAB — BASIC METABOLIC PANEL WITH GFR
Anion gap: 11 (ref 5–15)
BUN: 8 mg/dL (ref 6–20)
CO2: 23 mmol/L (ref 22–32)
Calcium: 9.2 mg/dL (ref 8.9–10.3)
Chloride: 108 mmol/L (ref 98–111)
Creatinine, Ser: 0.62 mg/dL (ref 0.44–1.00)
GFR, Estimated: >60 mL/min (ref >60)
Glucose, Bld: 122 mg/dL — ABNORMAL HIGH (ref 70–99)
Potassium: 3.7 mmol/L (ref 3.5–5.1)
Sodium: 142 mmol/L (ref 135–145)

## 2023-11-11 LAB — CBC
HCT: 40.7 % (ref 36.0–46.0)
Hemoglobin: 13.1 g/dL (ref 12.0–15.0)
MCH: 26.4 pg (ref 26.0–34.0)
MCHC: 32.2 g/dL (ref 30.0–36.0)
MCV: 82.1 fL (ref 80.0–100.0)
Platelets: 266 K/uL (ref 150–400)
RBC: 4.96 MIL/uL (ref 3.87–5.11)
RDW: 15.2 % (ref 11.5–15.5)
WBC: 11.1 K/uL — ABNORMAL HIGH (ref 4.0–10.5)
nRBC: 0 % (ref 0.0–0.2)

## 2023-11-11 LAB — SURGICAL PATHOLOGY

## 2023-11-11 LAB — MAGNESIUM: Magnesium: 2.1 mg/dL (ref 1.7–2.4)

## 2023-11-11 MED ORDER — METHOCARBAMOL 500 MG PO TABS: 500.0000 mg | ORAL_TABLET | Freq: Four times a day (QID) | ORAL | 0 refills | Status: DC | PRN

## 2023-11-11 MED ORDER — OXYCODONE HCL 5 MG PO TABS: 5.0000 mg | ORAL_TABLET | Freq: Four times a day (QID) | ORAL | 0 refills | Status: DC | PRN

## 2023-11-11 NOTE — Anesthesia Postprocedure Evaluation (Signed)
 Anesthesia Post Note  Patient: Sarah Fernandez  Procedure(s) Performed: LAPAROSCOPIC PARTIAL GASTRECTOMY (Abdomen) EGD (ESOPHAGOGASTRODUODENOSCOPY) (Esophagus)     Patient location during evaluation: Nursing Unit Anesthesia Type: General Level of consciousness: awake and alert Pain management: pain level controlled Vital Signs Assessment: post-procedure vital signs reviewed and stable Respiratory status: spontaneous breathing, nonlabored ventilation, respiratory function stable and patient connected to nasal cannula oxygen Cardiovascular status: blood pressure returned to baseline and stable Postop Assessment: no apparent nausea or vomiting Anesthetic complications: no   No notable events documented.  Last Vitals:  Vitals:   11/11/23 0102 11/11/23 0755  BP: (!) 143/101 (!) 149/100  Pulse: 79 99  Resp: 18 16  Temp: 36.7 C 36.7 C  SpO2: 98% 99%    Last Pain:  Vitals:   11/11/23 0755  TempSrc: Oral  PainSc:    Pain Goal: Patients Stated Pain Goal: 0 (11/10/23 0603)                 Garnette FORBES Skillern

## 2023-11-11 NOTE — Discharge Instructions (Addendum)
 [facility] DISCHARGE INSTRUCTIONS  Activity No heavy lifting greater than 10-15 pounds for 6 weeks after surgery. Ok to shower, but do not bathe or submerge incisions underwater. Do not drive while taking narcotic pain medication. You may drive when you are no longer taking prescription pain medication, you can comfortably wear a seatbelt, and you can safely maneuver your car and apply brakes.  Wound Care Your incisions are covered with skin glue called Dermabond. This will peel off on its own over time. You may shower and allow warm soapy water to run over your incisions. Gently pat dry. Do not submerge your incision underwater until cleared by your surgeon. Monitor your incision for any new redness, tenderness, or drainage. Many patients will experience some swelling and bruising at the incisions.  Ice packs will help.  Swelling and bruising can take several days to resolve.   Medications A  prescription for pain medication may be given to you upon discharge.  Take your pain medication as prescribed, if needed.  If narcotic pain medicine is not needed, then you may take acetaminophen  (Tylenol ) or ibuprofen (Advil) as needed. It is common to experience some constipation if taking pain medication after surgery.  Increasing fluid intake and taking a stool softener (such as Colace) will usually help or prevent this problem from occurring.  A mild laxative (Milk of Magnesia or Miralax) should be taken according to package directions if there are no bowel movements after 48 hours. Take your usually prescribed medications unless otherwise directed. If you need a refill on your pain medication, please contact your pharmacy.  They will contact our office to request authorization. Prescriptions will not be filled after 5 pm or on weekends.  When to Call Us : Fever greater than 100.5 New redness, drainage, or swelling at incision site Severe pain, nausea, or vomiting Persistent  bleeding from incisions  Follow-up You have been scheduled for a follow up appointment on Monday, September 29 at 2:00pm. This will be at the [facility] office at [address], [place], [place]. Please arrive about 15 minutes prior to your scheduled appointment time.  IF YOU HAVE DISABILITY OR FAMILY LEAVE FORMS, YOU MUST BRING THEM TO THE OFFICE FOR PROCESSING.   DO NOT GIVE THEM TO YOUR DOCTOR.  The clinic staff is available to answer your questions during regular business hours.  Please don't hesitate to call and ask to speak to one of the nurses for clinical concerns.  If you have a medical emergency, go to the nearest emergency room or call 911.  A surgeon from [facility] is always on call at the hospital  [address], [place], [place]  [address] ?  [address] [address], [place], [place]   [address] [phone] ? Toll Free: [phone] ? FAX [phone] Web site: [email or web address]      Managing Your Pain After Surgery Without Opioids    Thank you for participating in our program to help patients manage their pain after surgery without opioids. This is part of our effort to provide you with the best care possible, without exposing you or your family to the risk that opioids pose.  What pain can I expect after surgery? You can expect to have some pain after surgery. This is normal. The pain is typically worse the day after surgery, and quickly begins to get better. Many studies have found that many patients are able to manage their pain after surgery with  Over-the-Counter (OTC) medications such as Tylenol  and Motrin. If you have a condition that does not allow you to take Tylenol  or Motrin, notify your surgical team.  How will I manage my pain? The best strategy for controlling your pain after surgery is around the clock pain control with Tylenol  (acetaminophen ) and Motrin (ibuprofen or Advil). Alternating these  medications with each other allows you to maximize your pain control. In addition to Tylenol  and Motrin, you can use heating pads or ice packs on your incisions to help reduce your pain.  How will I alternate your regular strength over-the-counter pain medication? You will take a dose of pain medication every three hours. Start by taking 650 mg of Tylenol  (2 pills of 325 mg) 3 hours later take 600 mg of Motrin (3 pills of 200 mg) 3 hours after taking the Motrin take 650 mg of Tylenol  3 hours after that take 600 mg of Motrin.   - 1 -  See example - if your first dose of Tylenol  is at 12:00 PM   12:00 PM Tylenol  650 mg (2 pills of 325 mg)  3:00 PM Motrin 600 mg (3 pills of 200 mg)  6:00 PM Tylenol  650 mg (2 pills of 325 mg)  9:00 PM Motrin 600 mg (3 pills of 200 mg)  Continue alternating every 3 hours   We recommend that you follow this schedule around-the-clock for at least 3 days after surgery, or until you feel that it is no longer needed. Use the table on the last page of this handout to keep track of the medications you are taking. Important: Do not take more than 3000mg  of Tylenol  or 3200mg  of Motrin in a 24-hour period. Do not take ibuprofen/Motrin if you have a history of bleeding stomach ulcers, severe kidney disease, &/or actively taking a blood thinner  What if I still have pain? If you have pain that is not controlled with the over-the-counter pain medications (Tylenol  and Motrin or Advil) you might have what we call "breakthrough" pain. You will receive a prescription for a small amount of an opioid pain medication such as Oxycodone , Tramadol, or Tylenol  with Codeine. Use these opioid pills in the first 24 hours after surgery if you have breakthrough pain. Do not take more than 1 pill every 4-6 hours.  If you still have uncontrolled pain after using all opioid pills, don't hesitate to call our staff using the number provided. We will help make sure you are managing your pain  in the best way possible, and if necessary, we can provide a prescription for additional pain medication.   Day 1    Time  Name of Medication Number of pills taken  Amount of Acetaminophen   Pain Level   Comments  AM PM       AM PM       AM PM       AM PM       AM PM       AM PM       AM PM       AM PM       Total Daily amount of Acetaminophen  Do not take more than  3,000 mg per day      Day 2    Time  Name of Medication Number of pills taken  Amount of Acetaminophen   Pain Level   Comments  AM PM       AM PM       AM PM  AM PM       AM PM       AM PM       AM PM       AM PM       Total Daily amount of Acetaminophen  Do not take more than  3,000 mg per day      Day 3    Time  Name of Medication Number of pills taken  Amount of Acetaminophen   Pain Level   Comments  AM PM       AM PM       AM PM       AM PM         AM PM       AM PM       AM PM       AM PM       Total Daily amount of Acetaminophen  Do not take more than  3,000 mg per day      Day 4    Time  Name of Medication Number of pills taken  Amount of Acetaminophen   Pain Level   Comments  AM PM       AM PM       AM PM       AM PM       AM PM       AM PM       AM PM       AM PM       Total Daily amount of Acetaminophen  Do not take more than  3,000 mg per day      Day 5    Time  Name of Medication Number of pills taken  Amount of Acetaminophen   Pain Level   Comments  AM PM       AM PM       AM PM       AM PM       AM PM       AM PM       AM PM       AM PM       Total Daily amount of Acetaminophen  Do not take more than  3,000 mg per day      Day 6    Time  Name of Medication Number of pills taken  Amount of Acetaminophen   Pain Level  Comments  AM PM       AM PM       AM PM       AM PM       AM PM       AM PM       AM PM       AM PM       Total Daily amount of Acetaminophen  Do not take more than  3,000 mg per day      Day 7    Time   Name of Medication Number of pills taken  Amount of Acetaminophen   Pain Level   Comments  AM PM       AM PM       AM PM       AM PM       AM PM       AM PM       AM PM       AM PM       Total Daily amount of Acetaminophen  Do not take more than  3,000 mg  per day        For additional information about how and where to safely dispose of unused opioid medications - [email or web address]  Disclaimer: This document contains information and/or instructional materials adapted from [facility] for the typical patient with your condition. It does not replace medical advice from your health care provider because your experience may differ from that of the typical patient. Talk to your health care provider if you have any questions about this document, your condition or your treatment plan. Adapted from [facility]

## 2023-11-11 NOTE — Progress Notes (Signed)
 Pt verbalized understanding of discharge POC.  PIV not present during discharge.  Spouse at bedside.   Transferred to Main Entrance A.

## 2023-11-11 NOTE — Progress Notes (Signed)
 Patient is tolerating liquids, pain is well-controlled. She has mobilized independently this morning. Afebrile, vitals stable. Abdomen is soft, incisions are clean and dry with no erythema or induration. Will discharge home today. Discharge instructions were reviewed with the patient and all questions were answered.  Leonor Dawn, MD [facility] General, Hepatobiliary and Pancreatic Surgery 11/11/23 7:34 AM

## 2023-11-12 ENCOUNTER — Other Ambulatory Visit: Payer: Self-pay | Admitting: Surgery

## 2023-11-12 DIAGNOSIS — C49A2 Gastrointestinal stromal tumor of stomach: Secondary | ICD-10-CM

## 2023-11-17 NOTE — Discharge Summary (Signed)
 Physician Discharge Summary   Patient ID: Sarah Fernandez [id number] 48 y.o. 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 is a 48 yo female who was recently diagnosed with a gastric GIST after undergoing a workup for abdominal pain and bloating. This was confirmed with an EUS with biopsies of the gastric mass, and staging workup showed no evidence of metastatic disease. Please see separately dictated H&P for further details.  Hospital Course: The patient was taken to the OR on 11/10/23 for a laparoscopic gastric wedge resection. Please see separately dictated operative report for further details. Postoperatively she was admitted to the med-surg floor in stable condition. She was started on a liquid diet, which she tolerated. She was advanced to a soft diet on the morning of POD1. On POD1 she was tolerating solid food, pain was controlled with oral medications, and she was ambulating independently. She was examined and deemed appropriate for discharge home.  Consults: None  Significant Diagnostic Studies:  Surgical Pathology: A. STOMACH, PARTIAL GASTRECTOMY: Gastrointestinal stromal tumor, 2.5 cm. All surgical margins negative for tumor. See oncology table  Oncology table: GASTROINTESTINAL STROMAL TUMOR (GIST): Resection Procedure: Partial gastrectomy Tumor Focality: Unifocal Multiple Primary Sites: Not applicable Tumor Site: Stomach Tumor Size: 2.5 x 2.5 x 2 cm Histologic Type: Gastrointestinal stromal tumor, predominantly spindle cell type with minor epithelioid component Mitotic Rate: Focally  greater than 5 per 5 mm Histologic Grade: Intermediate grade Lymphatic and/or Vascular Invasion: Not identified Treatment Effect: No known presurgical therapy Risk Assessment: Moderate (2 to 5 cm and mitotic rate >5 per 5 mm) Margins: All margins negative for tumor      Closest Margin(s) to GIST: 7 mm      Distance from Closest Margin (mm or cm): Stapled margin Regional Lymph Nodes: No lymph nodes submitted Distant Metastasis:      Distant Site(s) Involved: Not applicable Pathologic Stage Classification (pTNM, AJCC 8th Edition): pT2, pN not assigned   Treatments: analgesia: acetaminophen , Dilaudid , and oxycodone  and surgery: laparoscopic gastric wedge resection  Discharge Exam: General: resting comfortably, NAD Neuro: alert and oriented, no focal deficits Resp: normal work of breathing on room air Abdomen: soft, nondistended, nontender to palpation. Incisions clean and dry with no erythema, induration or drainage. Extremities: warm and well-perfused   Disposition: Discharge disposition: 01-Home or Self Care       Patient Instructions:  Allergies as of 11/11/2023       Reactions   Flonase [fluticasone  Propionate]    Burned her nose         Medication List     TAKE these medications    acetaminophen  500 MG tablet Commonly known as: TYLENOL  Take 1,000 mg by mouth every 6 (six) hours as needed for moderate pain (pain score 4-6).   cetirizine 10 MG tablet Commonly known as: ZYRTEC Take 10 mg by mouth daily.   famotidine  20 MG tablet Commonly known as: PEPCID  Take 20 mg by mouth at bedtime.   ketotifen  0.035 % ophthalmic solution Commonly known as: ZADITOR  Place 1 drop into both eyes in the morning and at bedtime.   meloxicam 7.5 MG tablet Commonly known as: MOBIC Take 7.5 mg  by mouth daily as needed for pain.   methocarbamol  500 MG tablet Commonly known as: ROBAXIN  Take 1 tablet (500 mg total) by mouth every 6 (six) hours as needed for muscle spasms.    multivitamin with minerals tablet Take 1 tablet by mouth daily.   oxyCODONE  5 MG immediate release tablet Commonly known as: Oxy IR/ROXICODONE  Take 1 tablet (5 mg total) by mouth every 6 (six) hours as needed for severe pain (pain score 7-10).   pantoprazole  40 MG tablet Commonly known as: PROTONIX  Take 1 tablet (40 mg total) by mouth 2 (two) times daily before a meal.   sodium chloride  0.65 % Soln nasal spray Commonly known as: OCEAN Place 1 spray into both nostrils as needed for congestion.   Vitamin D-3 25 MCG (1000 UT) Caps Take 2,000 Units by mouth daily.       Activity: no driving while on analgesics and no heavy lifting for 8 weeks Diet: regular diet Wound Care: keep wound clean and dry  Follow-up with Dr. Dasie on 11/30/23.  Signed: Leonor LITTIE Dasie 11/17/2023 10:41 AM

## 2023-11-20 ENCOUNTER — Telehealth: Payer: Self-pay | Admitting: Nurse Practitioner

## 2023-11-20 NOTE — Telephone Encounter (Signed)
 Called in to reschedule her appt.

## 2023-11-24 ENCOUNTER — Ambulatory Visit (INDEPENDENT_AMBULATORY_CARE_PROVIDER_SITE_OTHER)

## 2023-11-24 VITALS — BP 156/102 | HR 97 | Temp 97.5°F | Ht 65.0 in | Wt 207.0 lb

## 2023-11-24 DIAGNOSIS — I10 Essential (primary) hypertension: Secondary | ICD-10-CM | POA: Diagnosis not present

## 2023-11-24 DIAGNOSIS — E782 Mixed hyperlipidemia: Secondary | ICD-10-CM

## 2023-11-24 DIAGNOSIS — E7800 Pure hypercholesterolemia, unspecified: Secondary | ICD-10-CM | POA: Diagnosis not present

## 2023-11-24 DIAGNOSIS — R030 Elevated blood-pressure reading, without diagnosis of hypertension: Secondary | ICD-10-CM

## 2023-11-24 DIAGNOSIS — E119 Type 2 diabetes mellitus without complications: Secondary | ICD-10-CM | POA: Diagnosis not present

## 2023-11-24 DIAGNOSIS — R918 Other nonspecific abnormal finding of lung field: Secondary | ICD-10-CM | POA: Insufficient documentation

## 2023-11-24 DIAGNOSIS — C49A2 Gastrointestinal stromal tumor of stomach: Secondary | ICD-10-CM | POA: Diagnosis not present

## 2023-11-24 DIAGNOSIS — Z1159 Encounter for screening for other viral diseases: Secondary | ICD-10-CM

## 2023-11-24 DIAGNOSIS — R7303 Prediabetes: Secondary | ICD-10-CM

## 2023-11-24 NOTE — Assessment & Plan Note (Signed)
 Last lipid panel: LDL 151, HDL 37, triglycerides 177. Encourage patient to continue with lifestyle changes in nutrition and increase physical activity. The 10-year ASCVD risk score (Arnett DK, et al., 2019) is: 5.8%. Updating lipid panel with labs.  She does have a family history of hyperlipidemia as well. Will discuss statin therapy which is generally recommended for anyone with diabetes.

## 2023-11-24 NOTE — Patient Instructions (Signed)
 VISIT SUMMARY: During today's visit, we reviewed your recovery from recent surgery, ongoing management of hypertension and prediabetes, and other health concerns. You are progressing well post-surgery, and we discussed plans for monitoring your lung nodules, managing your prediabetes and hypertension, and continuing treatment for GERD.  YOUR PLAN: -POSTOPERATIVE STATUS: You are two weeks post-surgery and experiencing some tenderness at the surgical site, which is normal. Your recovery is progressing well with no signs of infection. Please avoid applying lotions or soaps directly on the surgical site. Follow up with your oncologist on October 7th to determine if further treatment is necessary.  -BENIGN LUNG NODULES: You have benign lung nodules that were found incidentally. These are not a current concern but need to be monitored. We will order a CT scan of your lungs in one year and schedule follow-up appointments every six months to review their status.  -PREDIABETES: Your recent A1c level is 6.4%, which indicates prediabetes. This means your blood sugar levels are higher than normal but not high enough to be classified as diabetes. No medication is required at this time. Please continue with dietary modifications to reduce sugar intake and monitor your A1c levels regularly.  -HYPERTENSION: Your blood pressure is well-controlled at home with readings around 117/90 mmHg. The elevated reading in the clinic today is likely due to situational anxiety. Continue to monitor your blood pressure at home and document the readings before your appointments.  -GASTROESOPHAGEAL REFLUX DISEASE (GERD): Your GERD is being managed with pantoprazole  and Pepcid . Please continue taking pantoprazole  twice daily and use Pepcid  as needed.  INSTRUCTIONS: Follow up with your oncologist on October 7th to determine if further treatment is necessary. Continue home monitoring of your blood pressure and document the readings  before your appointments. We will order a CT scan of your lungs in one year and schedule follow-up appointments every six months to review the status of your lung nodules.  If you have any problems before your next visit feel free to message me via MyChart (minor issues or questions) or call the office, otherwise you may reach out to schedule an office visit.  Thank you! Saddie Sacks, PA-C

## 2023-11-24 NOTE — Assessment & Plan Note (Signed)
 BP 156/100, 156/102 on repeat. Patient brought recordings of her BP that she took the week before this appt and readings were 117/90, 100/82, etc. Advised patient to continue monitoring BP at home and if the readings become elevated, will discuss BP lowering medications.

## 2023-11-24 NOTE — Progress Notes (Signed)
 Established Patient Office Visit  Subjective   Patient ID: Sarah Fernandez, female    DOB: 1975/05/02  Age: 48 y.o. MRN: [id number]  Chief Complaint  Patient presents with   New Patient (Initial Visit)    Transfer of Care    HPI  History of Present Illness   Sarah Fernandez is a 48 year old female who presents for follow-up after recent surgery and ongoing management of hypertension and prediabetes.  Recent diagnosis of GIST - Postoperative status - Status post surgery two weeks ago - Ongoing tenderness localized to one side - Active in recovery, ambulating around home and attending church - No use of pain medications post-surgery, including oxycodone   High Blood Pressure without the diagnosis of HTN (white coat syndrome) - Blood pressure this morning measured at 156/100 mmHg - Home blood pressure readings typically around 117/90 mmHg - Regular self-monitoring of blood pressure  Abdominal pain and gastrointestinal symptoms - History of abdominal pain with associated bloating and constipation - Initial suspicion for gallbladder pathology - Treated for gastroesophageal reflux disease (GERD) - Imaging revealed inflamed pancreas and possible pancreatic stone - Multiple CT scans and MRI performed  Prediabetes - Diagnosed with prediabetes - Recent hemoglobin A1c of 6.4% - Not currently on medication for prediabetes - Managing condition through dietary modifications, including switching to Dr. Nunzio Zero  Pulmonary nodules - History of incidentally discovered benign lung nodules - Ongoing monitoring to assess for interval changes        ROS Per HPI.    Objective:     BP (!) 156/102   Pulse 97   Temp (!) 97.5 F (36.4 C) (Oral)   Ht 5' 5 (1.651 m)   Wt 207 lb (93.9 kg)   LMP 10/01/2021 (Approximate)   SpO2 99%   BMI 34.45 kg/m    Physical Exam Constitutional:      General: She is not in acute distress.    Appearance: Normal appearance.   Cardiovascular:     Rate and Rhythm: Normal rate and regular rhythm.     Heart sounds: Normal heart sounds. No murmur heard.    No friction rub. No gallop.  Pulmonary:     Effort: Pulmonary effort is normal. No respiratory distress.     Breath sounds: Normal breath sounds.  Abdominal:     General: Bowel sounds are normal.     Palpations: Abdomen is soft.     Comments: Porthole sites C/D/I and healing well  Musculoskeletal:        General: No swelling.  Skin:    General: Skin is warm and dry.  Neurological:     General: No focal deficit present.     Mental Status: She is alert.  Psychiatric:        Mood and Affect: Mood normal.        Behavior: Behavior normal.        Thought Content: Thought content normal.     No results found for any visits on 11/24/23.    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 Has follow ups with oncology in the next two weeks to discuss treatment.    Mixed hyperlipidemia Assessment & Plan: Last lipid panel: LDL 151, HDL 37, triglycerides 177. Encourage patient to continue with lifestyle changes in nutrition and increase physical activity. The 10-year ASCVD risk score (Arnett DK, et al., 2019) is: 5.8%. Updating lipid panel with labs.  She does have a family history of hyperlipidemia as well. Fernandez discuss statin therapy which is generally recommended for anyone with  diabetes.    Prediabetes Assessment & Plan: A1c previous 6.3, reached 6.7 last December, making a diagnosis of type 2 diabetes. A1c down to 6.4. Rechecking today with labs.   No medication required. Emphasized lifestyle modifications. - Monitor A1c levels regularly. - Encourage dietary modifications to reduce sugar intake.   White coat syndrome without diagnosis of hypertension Assessment & Plan: BP 156/100, 156/102 on repeat. Patient brought recordings of her BP that she took the week before this appt and readings were 117/90, 100/82, etc. Advised patient to continue monitoring BP at home and if the readings become elevated, Fernandez discuss BP lowering medications.   Lung nodules Assessment & Plan: Incidental benign lung nodules. No immediate concern, requires surveillance. - Order CT scan of lungs in one year to monitor nodules. - Schedule follow-up appointments every six months to review status.     Return in about 6 months (around 05/23/2024) for Elevated BP, DM, HLD .    Saddie JULIANNA Sacks, PA-C

## 2023-11-24 NOTE — Assessment & Plan Note (Signed)
 Incidental benign lung nodules. No immediate concern, requires surveillance. - Order CT scan of lungs in one year to monitor nodules. - Schedule follow-up appointments every six months to review status.

## 2023-11-24 NOTE — Assessment & Plan Note (Signed)
 Recovering well from procedure two weeks ago. Has follow ups with oncology in the next two weeks to discuss treatment.

## 2023-11-24 NOTE — Assessment & Plan Note (Signed)
 A1c previous 6.3, reached 6.7 last December, making a diagnosis of type 2 diabetes. A1c down to 6.4. Rechecking today with labs.   No medication required. Emphasized lifestyle modifications. - Monitor A1c levels regularly. - Encourage dietary modifications to reduce sugar intake.

## 2023-11-25 ENCOUNTER — Inpatient Hospital Stay

## 2023-11-25 ENCOUNTER — Inpatient Hospital Stay: Admitting: Nurse Practitioner

## 2023-11-30 NOTE — Progress Notes (Unsigned)
 [facility]  [phone]   HEMATOLOGY ONCOLOGY INPATIENT CONSULTATION   Sarah Fernandez  DOB: [birth year]  MR#: [id number]  CSN#: [id number]    Requesting Physician: [facility]  Patient Care Team: Sarah Fernandez as PCP - General (Physician Assistant) [name], [name], DO as Consulting Physician (Family Medicine) [name], DO as Consulting Physician (Obstetrics and Gynecology) [name], [place], [name] as Referring Physician (Optometry)  Reason for consult:   History of present illness:     MEDICAL HISTORY:  Past Medical History:  Diagnosis Date   Diabetes [facility])    Gastrointestinal stromal tumor (GIST) (HCC)    Hyperlipidemia     SURGICAL HISTORY: Past Surgical History:  Procedure Laterality Date   ESOPHAGOGASTRODUODENOSCOPY N/A 10/08/2023   Procedure: EGD (ESOPHAGOGASTRODUODENOSCOPY);  Surgeon: Wilhelmenia Aloha Raddle., MD;  Location: [facility];  Service: Gastroenterology;  Laterality: N/A;   ESOPHAGOGASTRODUODENOSCOPY N/A 11/10/2023   Procedure: EGD (ESOPHAGOGASTRODUODENOSCOPY);  Surgeon: Dasie Leonor CROME, MD;  Location: [facility] OR;  Service: General;  Laterality: N/A;   EUS N/A 10/08/2023   Procedure: ULTRASOUND, UPPER GI TRACT, ENDOSCOPIC;  Surgeon: Wilhelmenia Aloha Raddle., MD;  Location: [facility];  Service: Gastroenterology;  Laterality: N/A;   FINE NEEDLE ASPIRATION  10/08/2023   Procedure: FINE NEEDLE ASPIRATION;  Surgeon: Wilhelmenia Aloha Raddle., MD;  Location: [facility];  Service: Gastroenterology;;   LAPAROSCOPIC PARTIAL GASTRECTOMY N/A 11/10/2023   Procedure: LAPAROSCOPIC PARTIAL GASTRECTOMY;  Surgeon: Dasie Leonor CROME, MD;  Location: [facility] OR;  Service: General;  Laterality: N/A;   MYRINGOTOMY WITH TUBE PLACEMENT     TONSILLECTOMY AND ADENOIDECTOMY      SOCIAL HISTORY: Social History   Socioeconomic History   Marital status: Married    Spouse name: Not on file   Number of children: 0   Years of education: Not on file    Highest education level: 12th grade  Occupational History   Not on file  Tobacco Use   Smoking status: Never    Passive exposure: Never   Smokeless tobacco: Never  Vaping Use   Vaping status: Never Used  Substance and Sexual Activity   Alcohol use: No   Drug use: Not Currently   Sexual activity: Yes    Partners: Male    Birth control/protection: Condom  Other Topics Concern   Not on file  Social History Narrative   Not on file   Social Drivers of Health   Financial Resource Strain: Low Risk  (11/20/2023)   Overall Financial Resource Strain (CARDIA)    Difficulty of Paying Living Expenses: Not very hard  Food Insecurity: No Food Insecurity (11/20/2023)   Hunger Vital Sign    Worried About Running Out of Food in the Last Year: Never true    Ran Out of Food in the Last Year: Never true  Transportation Needs: No Transportation Needs (11/20/2023)   PRAPARE - Administrator, Civil Service (Medical): No    Lack of Transportation (Non-Medical): No  Physical Activity: Insufficiently Active (11/20/2023)   Exercise Vital Sign    Days of Exercise per Week: 4 days    Minutes of Exercise per Session: 20 min  Stress: Stress Concern Present (11/20/2023)   [employer] of Occupational Health - Occupational Stress Questionnaire    Feeling of Stress: To some extent  Social Connections: Socially Integrated (11/20/2023)   Social Connection and Isolation Panel    Frequency of Communication with Friends and Family: More than three times a week  Frequency of Social Gatherings with Friends and Family: More than three times a week    Attends Religious Services: More than 4 times per year    Active Member of [employer] or Organizations: Yes    Attends Engineer, structural: More than 4 times per year    Marital Status: Married  Catering manager Violence: Not At Risk (11/11/2023)   Humiliation, Afraid, Rape, and Kick questionnaire    Fear of Current or Ex-Partner: No     Emotionally Abused: No    Physically Abused: No    Sexually Abused: No    FAMILY HISTORY: Family History  Problem Relation Age of Onset   Diabetes Mother    Hypertension Mother    Hyperlipidemia Mother    Kidney Stones Mother    Hypertension Father    Diabetes Father    Hyperlipidemia Father    Lung cancer Father    Diabetes Sister    Heart disease Maternal Grandmother        MI   Heart disease Maternal Grandfather 67       MI   Heart attack Maternal Grandfather    Breast cancer Maternal Aunt 47   Colon cancer Maternal Aunt    Esophageal cancer Neg Hx     ALLERGIES:  is allergic to flonase [fluticasone  propionate].  MEDICATIONS:  Current Outpatient Medications  Medication Sig Dispense Refill   acetaminophen  (TYLENOL ) 500 MG tablet Take 1,000 mg by mouth every 6 (six) hours as needed for moderate pain (pain score 4-6).     cetirizine (ZYRTEC) 10 MG tablet Take 10 mg by mouth daily.     Cholecalciferol (VITAMIN D-3) 25 MCG (1000 UT) CAPS Take 2,000 Units by mouth daily.     famotidine  (PEPCID ) 20 MG tablet Take 20 mg by mouth at bedtime.     ketotifen  (ZADITOR ) 0.035 % ophthalmic solution Place 1 drop into both eyes in the morning and at bedtime.     methocarbamol  (ROBAXIN ) 500 MG tablet Take 1 tablet (500 mg total) by mouth every 6 (six) hours as needed for muscle spasms. 15 tablet 0   Multiple Vitamins-Minerals (MULTIVITAMIN WITH MINERALS) tablet Take 1 tablet by mouth daily.     pantoprazole  (PROTONIX ) 40 MG tablet Take 1 tablet (40 mg total) by mouth 2 (two) times daily before a meal. 60 tablet 12   sodium chloride  (OCEAN) 0.65 % SOLN nasal spray Place 1 spray into both nostrils as needed for congestion.     No current facility-administered medications for this visit.    REVIEW OF SYSTEMS:   Constitutional: Denies fevers, chills or abnormal night sweats Eyes: Denies blurriness of vision, double vision or watery eyes Ears, nose, mouth, throat, and face: Denies  mucositis or sore throat Respiratory: Denies cough, dyspnea or wheezes Cardiovascular: Denies palpitation, chest discomfort or lower extremity swelling Gastrointestinal:  Denies nausea, heartburn or change in bowel habits Skin: Denies abnormal skin rashes Lymphatics: Denies new lymphadenopathy or easy bruising Neurological:Denies numbness, tingling or new weaknesses Behavioral/Psych: Mood is stable, no new changes  All other systems were reviewed with the patient and are negative.  PHYSICAL EXAMINATION: ECOG PERFORMANCE STATUS: {CHL ONC ECOG PS:[id number]}  There were no vitals filed for this visit. There were no vitals filed for this visit.  GENERAL:alert, no distress and comfortable SKIN: skin color, texture, turgor are normal, no rashes or significant lesions EYES: normal, conjunctiva are pink and non-injected, sclera clear OROPHARYNX:no exudate, no erythema and lips, buccal mucosa, and tongue normal  NECK: supple, thyroid normal size, non-tender, without nodularity LYMPH:  no palpable lymphadenopathy in the cervical, axillary or inguinal LUNGS: clear to auscultation and percussion with normal breathing effort HEART: regular rate & rhythm and no murmurs and no lower extremity edema ABDOMEN:abdomen soft, non-tender and normal bowel sounds Musculoskeletal:no cyanosis of digits and no clubbing  PSYCH: alert & oriented x 3 with fluent speech NEURO: no focal motor/sensory deficits  LABORATORY DATA:  I have reviewed the data as listed Lab Results  Component Value Date   WBC 11.1 (H) 11/11/2023   HGB 13.1 11/11/2023   HCT 40.7 11/11/2023   MCV 82.1 11/11/2023   PLT 266 11/11/2023   Recent Labs    12/17/22 0925 04/16/23 1410 05/15/23 1101 10/30/23 0900 11/11/23 0237  NA  --  139  --  141 142  K  --  3.8  --  4.2 3.7  CL  --  103  --  104 108  CO2  --  27  --  26 23  GLUCOSE  --  111*  --  109* 122*  BUN  --  12  --  12 8  CREATININE  --  0.58  --  0.61 0.62  CALCIUM   --  9.4  --  9.7 9.2  GFRNONAA  --   --   --  >60 >60  PROT 7.4 7.3 7.1  --   --   ALBUMIN 4.2 4.0 4.4  --   --   AST 22 38* 26  --   --   ALT 38* 228* 37*  --   --   ALKPHOS 139* 292* 111  --   --   BILITOT 0.4 0.5 0.3  --   --   BILIDIR 0.1  --  0.1  --   --     RADIOGRAPHIC STUDIES: I have personally reviewed the radiological images as listed and agreed with the findings in the report. No results found.  ASSESSMENT & PLAN:  ***  Recommendations:   All questions were answered. The patient knows to call the clinic with any problems, questions or concerns.      Powell FORBES Lessen, NP 11/30/2023 5:16 PM

## 2023-12-01 ENCOUNTER — Inpatient Hospital Stay

## 2023-12-01 ENCOUNTER — Other Ambulatory Visit: Payer: Self-pay

## 2023-12-01 ENCOUNTER — Inpatient Hospital Stay: Attending: Nurse Practitioner | Admitting: Nurse Practitioner

## 2023-12-01 VITALS — BP 140/100 | HR 100 | Temp 98.3°F | Resp 17 | Wt 212.6 lb

## 2023-12-01 DIAGNOSIS — Z903 Acquired absence of stomach [part of]: Secondary | ICD-10-CM | POA: Insufficient documentation

## 2023-12-01 DIAGNOSIS — R7303 Prediabetes: Secondary | ICD-10-CM | POA: Diagnosis not present

## 2023-12-01 DIAGNOSIS — Z800 Family history of malignant neoplasm of digestive organs: Secondary | ICD-10-CM | POA: Diagnosis not present

## 2023-12-01 DIAGNOSIS — I10 Essential (primary) hypertension: Secondary | ICD-10-CM

## 2023-12-01 DIAGNOSIS — E119 Type 2 diabetes mellitus without complications: Secondary | ICD-10-CM

## 2023-12-01 DIAGNOSIS — C49A2 Gastrointestinal stromal tumor of stomach: Secondary | ICD-10-CM | POA: Diagnosis present

## 2023-12-01 DIAGNOSIS — Z1159 Encounter for screening for other viral diseases: Secondary | ICD-10-CM

## 2023-12-01 DIAGNOSIS — E782 Mixed hyperlipidemia: Secondary | ICD-10-CM

## 2023-12-01 DIAGNOSIS — Z803 Family history of malignant neoplasm of breast: Secondary | ICD-10-CM | POA: Insufficient documentation

## 2023-12-01 DIAGNOSIS — E785 Hyperlipidemia, unspecified: Secondary | ICD-10-CM | POA: Insufficient documentation

## 2023-12-01 DIAGNOSIS — Z801 Family history of malignant neoplasm of trachea, bronchus and lung: Secondary | ICD-10-CM | POA: Diagnosis not present

## 2023-12-01 DIAGNOSIS — K219 Gastro-esophageal reflux disease without esophagitis: Secondary | ICD-10-CM | POA: Insufficient documentation

## 2023-12-01 DIAGNOSIS — Z79899 Other long term (current) drug therapy: Secondary | ICD-10-CM | POA: Insufficient documentation

## 2023-12-01 NOTE — Progress Notes (Signed)
 PATIENT NAVIGATOR PROGRESS NOTE  Name: Sarah Fernandez Date: 12/01/2023 MRN: [id number]  DOB: 10-Jun-1975   Reason for visit:  Initial Med/Onc visit with Powell Lessen, NP and Dr. Onita Mattock  Comments:   Met with patient during their initial med/onc appt with Powell Lessen, NP.  Patient was accompanied by her husband and her sister.  Patient was given Journey's binder with information specific to her diagnosis. SDOH assessment was completed with no urgent needs noted.  Patient was given my direct contact information and was instructed to contact office with any questions or concerns.  Patient verbalized understanding.    Patient declined Nutrition referral.  Patient agreeable to Social Work referral.   Pathology was contacted to request for c-KIT mutation testing on the patient's surgical specimen.   Time spent counseling/coordinating care: > 60 minutes

## 2023-12-01 NOTE — Assessment & Plan Note (Signed)
 Initially, she had concerns of possible gallstones. She had upper abdominal pain, gas, and bloating. When alkaline phosphatase rise to level of 292, she was referred to GI. Initially, she had MR of the abdomen. An enhancement of the pancreatic head was seen, measuring 4.1 X 2.9 cm in diameter. Gallstones were present without evidence of gallbladder wall thickening or biliary ductal dilation. This was followed by a CT pancreas. This demonstrated a 1.9 cm polypoid lesion along the lesser curvature of the stomach. GIST tumor was felt to be possibility. She had EGD/EUS done per Dr. Wilhelmenia. Procedure confirmed the presence of polypoid lesion, originating from the muscularis propria. There were multiple gallstones noted within the gallbladder. No other abnormalities were noted.  Biopsy taken from the gastric mass indicated malignant cells present. This was comprised of mostly spindle cells which were positive for CD34 and CD117, negative for Desmin and SMA. Results were consistent with GIST tumor.  The patient underwent a partial gastrectomy on 11/10/2023. Biopsy of the mass confirmed gastrointestinal stromal tumor. It was 2.5 X 2.5 X 2 cm in diameter with negative surgical margins. This was intermediate grade without lymphatic or vascular invasion. This is considered to be a moderate risk GIST tumor, based on size 2 - 5 cm and mitotic rate of > 5/mm2. A CT of the chest done prior to surgery showed unchanged small pulmonary nodules, largest 0.5 cm of the peripheral left lung base, nonspecific but likely benign and incidental. She has already seen her primary care provider regarding the pulmonary nodules. Close surveillance is planned.  Today, she presents for consideration of possible adjuvant treatment with Gleevec. This is antineoplastic agent used to treat tumors with C-KIT mutations. The patient is young and overall, very healthy. Will obtain evaluation for C-KIT mutation, present in nearly 90% of GIST tumors. If  positive, will offer her initiation of adjuvant Gleevec for 3 years. Test can be taken from current tissue biopsy. Results will take 2 to 3 weeks. Will follow up with her over the phone. If positive, will offer her Gleevec in order to prevent recurrent disease. The patient and her family members voiced understanding and agreement with this plan.

## 2023-12-02 ENCOUNTER — Other Ambulatory Visit

## 2023-12-02 ENCOUNTER — Other Ambulatory Visit: Payer: Self-pay

## 2023-12-02 DIAGNOSIS — K851 Biliary acute pancreatitis without necrosis or infection: Secondary | ICD-10-CM

## 2023-12-02 DIAGNOSIS — E782 Mixed hyperlipidemia: Secondary | ICD-10-CM

## 2023-12-02 DIAGNOSIS — Z1159 Encounter for screening for other viral diseases: Secondary | ICD-10-CM

## 2023-12-02 DIAGNOSIS — R7989 Other specified abnormal findings of blood chemistry: Secondary | ICD-10-CM

## 2023-12-02 DIAGNOSIS — C49A0 Gastrointestinal stromal tumor, unspecified site: Secondary | ICD-10-CM

## 2023-12-02 DIAGNOSIS — E119 Type 2 diabetes mellitus without complications: Secondary | ICD-10-CM

## 2023-12-02 DIAGNOSIS — I10 Essential (primary) hypertension: Secondary | ICD-10-CM

## 2023-12-02 NOTE — Progress Notes (Signed)
 PATIENT NAVIGATOR PROGRESS NOTE  Name: Sarah Fernandez Date: 12/02/2023 MRN: [id number]  DOB: [birth year]   Reason for visit:  GI Conference follow-up   Comments:   Patient's case was discussed during the 10/1 GI Multidisciplinary Conference.  Genetics referral was recommended due her diagnosis of GIST tumor and concerns for possible SDHx mutations along with a few others.   Patient was contacted and informed of Genetics referral recommendation. Patient stated she would like to take some time to think about it.  Patient was instructed to call office when she had made her decision.  Patient is scheduled for follow-up visit in our office on 10/21.  Will follow-up with patient at that time if she has not yet made her decision.   Time spent counseling/coordinating care: 30-45 minutes

## 2023-12-02 NOTE — Progress Notes (Signed)
 The proposed treatment discussed in conference is for discussion purpose only and is not a binding recommendation.  The patients have not been physically examined, or presented with their treatment options.  Therefore, final treatment plans cannot be decided.

## 2023-12-03 ENCOUNTER — Inpatient Hospital Stay: Attending: Hematology | Admitting: Licensed Clinical Social Worker

## 2023-12-03 DIAGNOSIS — C49A2 Gastrointestinal stromal tumor of stomach: Secondary | ICD-10-CM

## 2023-12-03 LAB — HIV ANTIBODY (ROUTINE TESTING W REFLEX): HIV Screen 4th Generation wRfx: NONREACTIVE

## 2023-12-03 LAB — COMPREHENSIVE METABOLIC PANEL WITH GFR
ALT: 82 IU/L — ABNORMAL HIGH (ref 0–32)
AST: 34 IU/L (ref 0–40)
Albumin: 4.4 g/dL (ref 3.9–4.9)
Alkaline Phosphatase: 179 IU/L — ABNORMAL HIGH (ref 41–116)
BUN/Creatinine Ratio: 23 (ref 9–23)
BUN: 14 mg/dL (ref 6–24)
Bilirubin Total: 0.4 mg/dL (ref 0.0–1.2)
CO2: 21 mmol/L (ref 20–29)
Calcium: 9.5 mg/dL (ref 8.7–10.2)
Chloride: 102 mmol/L (ref 96–106)
Creatinine, Ser: 0.62 mg/dL (ref 0.57–1.00)
Globulin, Total: 2.3 g/dL (ref 1.5–4.5)
Glucose: 88 mg/dL (ref 70–99)
Potassium: 4 mmol/L (ref 3.5–5.2)
Sodium: 140 mmol/L (ref 134–144)
Total Protein: 6.7 g/dL (ref 6.0–8.5)
eGFR: 110 mL/min/1.73 (ref >59)

## 2023-12-03 LAB — HEMOGLOBIN A1C
Est. average glucose Bld gHb Est-mCnc: 128 mg/dL
Hgb A1c MFr Bld: 6.1 % — ABNORMAL HIGH (ref 4.8–5.6)

## 2023-12-03 LAB — CBC WITH DIFFERENTIAL/PLATELET
Basophils Absolute: 0 x10E3/uL (ref 0.0–0.2)
Basos: 1 %
EOS (ABSOLUTE): 0.1 x10E3/uL (ref 0.0–0.4)
Eos: 2 %
Hematocrit: 42 % (ref 34.0–46.6)
Hemoglobin: 13.9 g/dL (ref 11.1–15.9)
Immature Grans (Abs): 0 x10E3/uL (ref 0.0–0.1)
Immature Granulocytes: 0 %
Lymphocytes Absolute: 2.1 x10E3/uL (ref 0.7–3.1)
Lymphs: 27 %
MCH: 27 pg (ref 26.6–33.0)
MCHC: 33.1 g/dL (ref 31.5–35.7)
MCV: 82 fL (ref 79–97)
Monocytes Absolute: 0.6 x10E3/uL (ref 0.1–0.9)
Monocytes: 8 %
Neutrophils Absolute: 4.7 x10E3/uL (ref 1.4–7.0)
Neutrophils: 62 %
Platelets: 269 x10E3/uL (ref 150–450)
RBC: 5.14 x10E6/uL (ref 3.77–5.28)
RDW: 14.6 % (ref 11.7–15.4)
WBC: 7.6 x10E3/uL (ref 3.4–10.8)

## 2023-12-03 LAB — LIPID PANEL
Chol/HDL Ratio: 4.8 ratio — ABNORMAL HIGH (ref 0.0–4.4)
Cholesterol, Total: 240 mg/dL — ABNORMAL HIGH (ref 100–199)
HDL: 50 mg/dL (ref >39)
LDL Chol Calc (NIH): 168 mg/dL — ABNORMAL HIGH (ref 0–99)
Triglycerides: 122 mg/dL (ref 0–149)
VLDL Cholesterol Cal: 22 mg/dL (ref 5–40)

## 2023-12-03 LAB — VITAMIN D 25 HYDROXY (VIT D DEFICIENCY, FRACTURES): Vit D, 25-Hydroxy: 47.6 ng/mL (ref 30.0–100.0)

## 2023-12-03 LAB — TSH: TSH: 0.879 u[IU]/mL (ref 0.450–4.500)

## 2023-12-03 LAB — HEPATITIS C ANTIBODY: Hep C Virus Ab: NONREACTIVE

## 2023-12-03 NOTE — Progress Notes (Signed)
 [facility] Clinical Social Work  Initial Assessment   Sarah Fernandez is a 48 y.o. year old female contacted by phone. Clinical Social Work was referred by medical provider for assessment of psychosocial needs.   SDOH (Social Determinants of Health) assessments performed: Yes   SDOH Screenings   Food Insecurity: No Food Insecurity (12/01/2023)  Housing: Low Risk  (12/01/2023)  Transportation Needs: No Transportation Needs (12/01/2023)  Utilities: Not At Risk (12/01/2023)  Alcohol Screen: Low Risk  (12/02/2022)  Depression (PHQ2-9): Low Risk  (12/01/2023)  Financial Resource Strain: Low Risk  (11/20/2023)  Physical Activity: Insufficiently Active (11/20/2023)  Social Connections: Socially Integrated (11/20/2023)  Stress: Stress Concern Present (11/20/2023)  Tobacco Use: Low Risk  (11/30/2023)   Received from [facility]  Health Literacy: Adequate Health Literacy (12/02/2022)    PHQ 2/9:    12/01/2023   11:24 AM 11/24/2023    8:44 AM 03/05/2023    8:55 AM  Depression screen PHQ 2/9  Decreased Interest 0 0 0  Down, Depressed, Hopeless 0 0 0  PHQ - 2 Score 0 0 0  Altered sleeping 1 1 0  Tired, decreased energy 1 1 0  Change in appetite 1 1 0  Feeling bad or failure about yourself  0 0 0  Trouble concentrating 0 0 0  Moving slowly or fidgety/restless 0 0 0  Suicidal thoughts 0 0 0  PHQ-9 Score 3 3 0  Difficult doing work/chores   Not difficult at all     Distress Screen completed: No     No data to display            Family/Social Information:  Housing Arrangement: patient lives with her husband.  Family members/support persons in your life? Pt provides support to her two elderly parents.  Pt has a sister in [place] who assisted as needed while pt recovered from surgery. Transportation concerns: no  Employment: Working full time pt works from home for [employer].  Pt's husband is a Psychologist, occupational.  Income source: Employment Financial concerns: No Type of concern: None Food  access concerns: no Religious or spiritual practice: Yes-[employer] Advanced directives: No Services Currently in place:  none  Coping/ Adjustment to diagnosis: Patient understands treatment plan and what happens next? Pt is awaiting pathology to determine final treatment plan. Concerns about diagnosis and/or treatment: Overwhelmed by information Patient reported stressors: Anxiety/ nervousness and Adjusting to my illness Hopes and/or priorities: pt's priority was to get through surgery w/ the hope that surgery will be the only treatment necessary. Patient enjoys time with family/ friends Current coping skills/ strengths: Capable of independent living , Motivation for treatment/growth , Physical Health , and Supportive family/friends     SUMMARY: Current SDOH Barriers:  No barriers identified at this time.  Clinical Social Work Clinical Goal(s):  No clinical social work goals at this time  Interventions: Discussed common feeling and emotions when being diagnosed with cancer, and the importance of support during treatment Informed patient of the support team roles and support services at [facility] Provided CSW contact information and encouraged patient to call with any questions or concerns    Follow Up Plan: Patient will contact CSW with any support or resource needs Patient verbalizes understanding of plan: Yes    Devere JONELLE Manna, LCSW Clinical Social Worker [facility]

## 2023-12-07 ENCOUNTER — Ambulatory Visit: Payer: Self-pay

## 2023-12-08 ENCOUNTER — Ambulatory Visit: Admitting: Nurse Practitioner

## 2023-12-08 ENCOUNTER — Other Ambulatory Visit

## 2023-12-10 ENCOUNTER — Other Ambulatory Visit: Payer: Self-pay | Admitting: Gastroenterology

## 2023-12-10 ENCOUNTER — Encounter (HOSPITAL_COMMUNITY): Payer: Self-pay | Admitting: Hematology

## 2023-12-11 NOTE — Progress Notes (Signed)
 PATIENT NAVIGATOR PROGRESS NOTE  Name: Sarah Fernandez Date: 12/11/2023 MRN: [id number]  DOB: Jul 30, 1975  Request for add-on PDGFRA mutation testing sent to [facility] pathology per Dr. Demetra request.

## 2023-12-21 NOTE — Progress Notes (Deleted)
 Patient Care Team: Sarah Fernandez Sarah Fernandez as PCP - General (Physician Assistant) [name], [name], DO as Consulting Physician (Family Medicine) [name], DO as Consulting Physician (Obstetrics and Gynecology) [name], My [place], [name] as Referring Physician (Optometry) Ardis, [name], RN as Oncology Nurse Navigator  Clinic Day:  12/21/2023  Referring physician: [name], PA-C  I connected with Sarah Fernandez on 12/21/23 at  9:00 AM EDT by telephone and verified that I am speaking with the correct person using two identifiers.   I discussed the limitations, risks, security and privacy concerns of performing an evaluation and management service by telemedicine and the availability of in-person appointments. I also discussed with the patient that there may be a patient responsible charge related to this service. The patient expressed understanding and agreed to proceed.   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 She had upper abdominal pain, gas, and bloating. When alkaline phosphatase rise to level of 292, she was referred to GI. Initially, she had MR of the abdomen. An enhancement of the pancreatic head was seen, measuring 4.1 X 2.9 cm in diameter. Gallstones were present without evidence of gallbladder wall thickening or biliary ductal dilation. This was followed by a CT pancreas. This demonstrated a 1.9 cm polypoid lesion along the lesser curvature of the stomach. GIST tumor was felt to be possibility. She had EGD/EUS done per Dr. Wilhelmenia. Procedure confirmed the presence of polypoid lesion, originating from the muscularis propria. There were multiple gallstones noted within the gallbladder. No other abnormalities  were noted.  Biopsy taken from the gastric mass indicated malignant cells present. This was comprised of mostly spindle cells which were positive for CD34 and CD117, negative for Desmin and SMA. Results were consistent with GIST tumor.  The patient underwent a partial gastrectomy on 11/10/2023. Biopsy of the mass confirmed gastrointestinal stromal tumor. It was 2.5 X 2.5 X 2 cm in diameter with negative surgical margins. This was intermediate grade without lymphatic or vascular invasion. This is considered to be a moderate risk GIST tumor, based on size 2 - 5 cm and mitotic rate of > 5/mm2. A CT of the chest done prior to surgery showed unchanged small pulmonary nodules, largest 0.5 cm of the peripheral left lung base, nonspecific but likely benign and incidental. She has already seen her primary care provider regarding the pulmonary nodules. Close surveillance is planned.  Today, she presents for consideration of possible adjuvant treatment with Gleevec. This is antineoplastic agent used to treat tumors with C-KIT mutations. The patient is young and overall, very healthy. Will obtain evaluation for C-KIT mutation, present in nearly 90% of GIST tumors. If positive, will offer her initiation of adjuvant Gleevec for 3 years. Test can be taken from current tissue biopsy. Results will take 2 to 3 weeks. Will follow up with her over the phone. If positive, will offer her Gleevec in order to prevent recurrent disease. The patient and her family members voiced understanding and agreement with this plan.     The patient understands the plans discussed today and is in agreement with them.  She knows to contact our office if she develops concerns prior to her next appointment.  I provided *** minutes of face-to-face  time during this encounter and > 50% was spent counseling as documented under my assessment and plan.    Powell FORBES Lessen, NP  [facility] - A DEPT OF [facility]  [facility] [address] [place] [address] Dept: [phone] Dept Fax: [phone]   No orders of the defined types were placed in this encounter.     CHIEF COMPLAINT:  CC: GIST tumor  Current Treatment:  pending possible Gleevec   INTERVAL HISTORY:  Chiann is here today for repeat clinical assessment. She denies fevers or chills.initial visit with me on 12/01/2023. Molecular pathology report was  negative for C-KIT mutation.  She denies pain. Her appetite is good. Her weight {Weight change:10426}.  I have reviewed the past medical history, past surgical history, social history and family history with the patient and they are unchanged from previous note.  ALLERGIES:  is allergic to flonase [fluticasone  propionate].  MEDICATIONS:  Current Outpatient Medications  Medication Sig Dispense Refill   acetaminophen  (TYLENOL ) 500 MG tablet Take 1,000 mg by mouth every 6 (six) hours as needed for moderate pain (pain score 4-6).     cetirizine (ZYRTEC) 10 MG tablet Take 10 mg by mouth daily.     Cholecalciferol (VITAMIN D-3) 25 MCG (1000 UT) CAPS Take 2,000 Units by mouth daily.     famotidine  (PEPCID ) 20 MG tablet Take 20 mg by mouth at bedtime.     ketotifen  (ZADITOR ) 0.035 % ophthalmic solution Place 1 drop into both eyes in the morning and at bedtime.     methocarbamol  (ROBAXIN ) 500 MG tablet Take 1 tablet (500 mg total) by mouth every 6 (six) hours as needed for muscle spasms. 15 tablet 0   Multiple Vitamins-Minerals (MULTIVITAMIN WITH MINERALS) tablet Take 1 tablet by mouth daily.     pantoprazole  (PROTONIX ) 40 MG tablet TAKE 1 TABLET BY MOUTH EVERY DAY 90 tablet 1   sodium chloride  (OCEAN) 0.65 % SOLN nasal spray Place 1 spray into both nostrils as needed for congestion.     No current facility-administered medications for this visit.    HISTORY OF PRESENT ILLNESS:   Oncology History   No history exists.      REVIEW OF SYSTEMS:   Constitutional: Denies  fevers, chills or abnormal weight loss Eyes: Denies blurriness of vision Ears, nose, mouth, throat, and face: Denies mucositis or sore throat Respiratory: Denies cough, dyspnea or wheezes Cardiovascular: Denies palpitation, chest discomfort or lower extremity swelling Gastrointestinal:  Denies nausea, heartburn or change in bowel habits Skin: Denies abnormal skin rashes Lymphatics: Denies new lymphadenopathy or easy bruising Neurological:Denies numbness, tingling or new weaknesses Behavioral/Psych: Mood is stable, no new changes  All other systems were reviewed with the patient and are negative.   VITALS:  Last menstrual period 10/01/2021.  Wt Readings from Last 3 Encounters:  12/01/23 212 lb 9.6 oz (96.4 kg)  11/24/23 207 lb (93.9 kg)  11/10/23 209 lb 14.1 oz (95.2 kg)    There is no height or weight on file to calculate BMI.  Performance status (ECOG): {CHL ONC [id number]  PHYSICAL EXAM:   GENERAL:alert, no distress and comfortable SKIN: skin color, texture, turgor are normal, no rashes or significant lesions EYES: normal, Conjunctiva are pink and non-injected, sclera clear OROPHARYNX:no exudate, no erythema and lips, buccal mucosa, and tongue normal  NECK: supple, thyroid normal size, non-tender, without nodularity LYMPH:  no palpable lymphadenopathy in the cervical, axillary or inguinal LUNGS: clear  to auscultation and percussion with normal breathing effort HEART: regular rate & rhythm and no murmurs and no lower extremity edema ABDOMEN:abdomen soft, non-tender and normal bowel sounds Musculoskeletal:no cyanosis of digits and no clubbing  NEURO: alert & oriented x 3 with fluent speech, no focal motor/sensory deficits  LABORATORY DATA:  I have reviewed the data as listed    Component Value Date/Time   NA 140 12/02/2023 0953   K 4.0 12/02/2023 0953   CL 102 12/02/2023 0953   CO2 21 12/02/2023 0953   GLUCOSE 88 12/02/2023 0953   GLUCOSE 122 (H) 11/11/2023 0237    BUN 14 12/02/2023 0953   CREATININE 0.62 12/02/2023 0953   CALCIUM 9.5 12/02/2023 0953   PROT 6.7 12/02/2023 0953   ALBUMIN 4.4 12/02/2023 0953   AST 34 12/02/2023 0953   ALT 82 (H) 12/02/2023 0953   ALKPHOS 179 (H) 12/02/2023 0953   BILITOT 0.4 12/02/2023 0953   GFRNONAA >60 11/11/2023 0237   GFRAA 127 10/04/2018 0908    No results found for: SPEP, UPEP  Lab Results  Component Value Date   WBC 7.6 12/02/2023   NEUTROABS 4.7 12/02/2023   HGB 13.9 12/02/2023   HCT 42.0 12/02/2023   MCV 82 12/02/2023   PLT 269 12/02/2023      Chemistry      Component Value Date/Time   NA 140 12/02/2023 0953   K 4.0 12/02/2023 0953   CL 102 12/02/2023 0953   CO2 21 12/02/2023 0953   BUN 14 12/02/2023 0953   CREATININE 0.62 12/02/2023 0953      Component Value Date/Time   CALCIUM 9.5 12/02/2023 0953   ALKPHOS 179 (H) 12/02/2023 0953   AST 34 12/02/2023 0953   ALT 82 (H) 12/02/2023 0953   BILITOT 0.4 12/02/2023 0953       RADIOGRAPHIC STUDIES: I have personally reviewed the radiological images as listed and agreed with the findings in the report. No results found.

## 2023-12-21 NOTE — Assessment & Plan Note (Deleted)
 Initially, she had concerns of possible gallstones. She had upper abdominal pain, gas, and bloating. When alkaline phosphatase rise to level of 292, she was referred to GI. Initially, she had MR of the abdomen. An enhancement of the pancreatic head was seen, measuring 4.1 X 2.9 cm in diameter. Gallstones were present without evidence of gallbladder wall thickening or biliary ductal dilation. This was followed by a CT pancreas. This demonstrated a 1.9 cm polypoid lesion along the lesser curvature of the stomach. GIST tumor was felt to be possibility. She had EGD/EUS done per Dr. Wilhelmenia. Procedure confirmed the presence of polypoid lesion, originating from the muscularis propria. There were multiple gallstones noted within the gallbladder. No other abnormalities were noted.  Biopsy taken from the gastric mass indicated malignant cells present. This was comprised of mostly spindle cells which were positive for CD34 and CD117, negative for Desmin and SMA. Results were consistent with GIST tumor.  The patient underwent a partial gastrectomy on 11/10/2023. Biopsy of the mass confirmed gastrointestinal stromal tumor. It was 2.5 X 2.5 X 2 cm in diameter with negative surgical margins. This was intermediate grade without lymphatic or vascular invasion. This is considered to be a moderate risk GIST tumor, based on size 2 - 5 cm and mitotic rate of > 5/mm2. A CT of the chest done prior to surgery showed unchanged small pulmonary nodules, largest 0.5 cm of the peripheral left lung base, nonspecific but likely benign and incidental. She has already seen her primary care provider regarding the pulmonary nodules. Close surveillance is planned.  Today, she presents for consideration of possible adjuvant treatment with Gleevec. This is antineoplastic agent used to treat tumors with C-KIT mutations. The patient is young and overall, very healthy. Will obtain evaluation for C-KIT mutation, present in nearly 90% of GIST tumors. If  positive, will offer her initiation of adjuvant Gleevec for 3 years. Test can be taken from current tissue biopsy. Results will take 2 to 3 weeks. Will follow up with her over the phone. If positive, will offer her Gleevec in order to prevent recurrent disease. The patient and her family members voiced understanding and agreement with this plan.

## 2023-12-22 ENCOUNTER — Inpatient Hospital Stay: Admitting: Nurse Practitioner

## 2023-12-22 DIAGNOSIS — C49A2 Gastrointestinal stromal tumor of stomach: Secondary | ICD-10-CM

## 2023-12-25 ENCOUNTER — Encounter (HOSPITAL_COMMUNITY): Payer: Self-pay | Admitting: Hematology

## 2023-12-28 NOTE — Assessment & Plan Note (Signed)
 Initially, she had concerns of possible gallstones. She had upper abdominal pain, gas, and bloating. When alkaline phosphatase rise to level of 292, she was referred to GI. Initially, she had MR of the abdomen. An enhancement of the pancreatic head was seen, measuring 4.1 X 2.9 cm in diameter. Gallstones were present without evidence of gallbladder wall thickening or biliary ductal dilation. This was followed by a CT pancreas. This demonstrated a 1.9 cm polypoid lesion along the lesser curvature of the stomach. GIST tumor was felt to be possibility. She had EGD/EUS done per Dr. Wilhelmenia. Procedure confirmed the presence of polypoid lesion, originating from the muscularis propria. There were multiple gallstones noted within the gallbladder. No other abnormalities were noted.  Biopsy taken from the gastric mass indicated malignant cells present. This was comprised of mostly spindle cells which were positive for CD34 and CD117, negative for Desmin and SMA. Results were consistent with GIST tumor.  The patient underwent a partial gastrectomy on 11/10/2023. Biopsy of the mass confirmed gastrointestinal stromal tumor. It was 2.5 X 2.5 X 2 cm in diameter with negative surgical margins. This was intermediate grade without lymphatic or vascular invasion. This is considered to be a moderate risk GIST tumor, based on size 2 - 5 cm and mitotic rate of > 5/mm2. A CT of the chest done prior to surgery showed unchanged small pulmonary nodules, largest 0.5 cm of the peripheral left lung base, nonspecific but likely benign and incidental. She has already seen her primary care provider regarding the pulmonary nodules. Close surveillance is planned.  Today, she presents for consideration of possible adjuvant treatment with Gleevec. This is antineoplastic agent used to treat tumors with C-KIT mutations. The patient is young and overall, very healthy. Will obtain evaluation for C-KIT mutation, present in nearly 90% of GIST tumors. If  positive, will offer her initiation of adjuvant Gleevec for 3 years. Test can be taken from current tissue biopsy. Results will take 2 to 3 weeks. Will follow up with her over the phone. If positive, will offer her Gleevec in order to prevent recurrent disease. The patient and her family members voiced understanding and agreement with this plan.

## 2023-12-28 NOTE — Progress Notes (Unsigned)
 Patient Care Team: [name] as PCP - General (Physician Assistant) [name], [name], DO as Consulting Physician (Family Medicine) [name], DO as Consulting Physician (Obstetrics and Gynecology) [name], My [place], [name] as Referring Physician (Optometry) Ardis, [name], RN as Oncology Nurse Navigator  Clinic Day:  12/29/2023  Referring physician: [name], PA-C  I connected with Sarah Fernandez on 12/29/23 at  9:30 AM EDT by telephone and verified that I am speaking with the correct person using two identifiers.   I discussed the limitations, risks, security and privacy concerns of performing an evaluation and management service by telemedicine and the availability of in-person appointments. I also discussed with the patient that there may be a patient responsible charge related to this service. The patient expressed understanding and agreed to proceed.   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 She had upper abdominal pain, gas, and bloating. When alkaline phosphatase rise to level of 292, she was referred to GI. Initially, she had MR of the abdomen. An enhancement of the pancreatic head was seen, measuring 4.1 X 2.9 cm in diameter. Gallstones were present without evidence of gallbladder wall thickening or biliary ductal dilation. This was followed by a CT pancreas. This demonstrated a 1.9 cm polypoid lesion along the lesser curvature of the stomach. GIST tumor was felt to be possibility. She had EGD/EUS done per Dr. Wilhelmenia. Procedure confirmed the presence of polypoid lesion, originating from the muscularis propria. There were multiple gallstones noted within  the gallbladder. No other abnormalities were noted.  Biopsy taken from the gastric mass indicated malignant cells present. This was comprised of mostly spindle cells which were positive for CD34 and CD117, negative for Desmin and SMA. Results were consistent with GIST tumor.  The patient underwent a partial gastrectomy on 11/10/2023. Biopsy of the mass confirmed gastrointestinal stromal tumor. It was 2.5 X 2.5 X 2 cm in diameter with negative surgical margins. This was intermediate grade without lymphatic or vascular invasion. This is considered to be a moderate risk GIST tumor, based on size 2 - 5 cm and mitotic rate of > 5/mm2. A CT of the chest done prior to surgery showed unchanged small pulmonary nodules, largest 0.5 cm of the peripheral left lung base, nonspecific but likely benign and incidental. She has already seen her primary care provider regarding the pulmonary nodules. Close surveillance is planned.  Today, she presents for consideration of possible adjuvant treatment with Gleevec. This is antineoplastic agent used to treat tumors with C-KIT mutations. The patient is young and overall, very healthy. Will obtain evaluation for C-KIT mutation, present in nearly 90% of GIST tumors.  Her biopsies were negative for both C-KIT mutation and for PDGFRa mutation. She would not benefit from treatment with adjuvant Gleevec. Plan to follow up with her clinically every 3 - 4 months. Will repeat her CT CAP every 6 to 12 months. She understands and agrees with the plan moving forward.     GIST tumor  Review of pathology report to see if she was candidate for treatment for Gleevec. Her tumor was negative for both C-KIT mutation and PDGFRa mutation. Based on these results, she would not benefit  from treatment with Gleevec. She is at moderate risk of recurrence since her tumor was > 2 cm in diameter. She was also found to have small, stable pulmonary nodules which are currently followed by her primary care. Due to  increased risk, plan to follow up with her every 3 to 4 months with check of labs. Repeat CT CAP every 6 to 12 months for early detection of recurrence or metastatic disease. The patient voiced understanding of the plan. All questions were answered.   The patient understands the plans discussed today and is in agreement with them.  She knows to contact our office if she develops concerns prior to her next appointment.  I provided 10 minutes of face-to-face time during this encounter and > 50% was spent counseling as documented under my assessment and plan.    Powell FORBES Lessen, NP  [facility] - A DEPT OF [facility] [address] [place] [address] Dept: [phone] Dept Fax: [phone]   No orders of the defined types were placed in this encounter.     CHIEF COMPLAINT:  CC: GIST tumor  Current Treatment:  surveillance  INTERVAL HISTORY:  Sarah Fernandez is here today for repeat clinical assessment. She denies fevers or chills.initial visit with me on 12/01/2023. Molecular pathology report was  negative for C-KIT mutation.  Biopsy was also negative for PDGFRa mutation. These results indicate that she would not benefit from treatment with Gleevec. Will monitor her closely. Plan to check labs and follow up with her in clinic every 3 to 4 months. Will repeat CT scans every 6 to 12 months. She reports that she continues to heal well from surgery. She denies abdominal pain, nausea, vomiting, or diarrhea. She denies presence of blood in her stool.  She denies pain. Her appetite is good. Her weight has been stable.  I have reviewed the past medical history, past surgical history, social history and family history with the patient and they are unchanged from previous note.  ALLERGIES:  is allergic to flonase [fluticasone  propionate].  MEDICATIONS:  Current Outpatient Medications  Medication Sig Dispense Refill   acetaminophen   (TYLENOL ) 500 MG tablet Take 1,000 mg by mouth every 6 (six) hours as needed for moderate pain (pain score 4-6).     cetirizine (ZYRTEC) 10 MG tablet Take 10 mg by mouth daily.     Cholecalciferol (VITAMIN D-3) 25 MCG (1000 UT) CAPS Take 2,000 Units by mouth daily.     famotidine  (PEPCID ) 20 MG tablet Take 20 mg by mouth at bedtime.     ketotifen  (ZADITOR ) 0.035 % ophthalmic solution Place 1 drop into both eyes in the morning and at bedtime.     Multiple Vitamins-Minerals (MULTIVITAMIN WITH MINERALS) tablet Take 1 tablet by mouth daily.     pantoprazole  (PROTONIX ) 40 MG tablet TAKE 1 TABLET BY MOUTH EVERY DAY 90 tablet 1   sodium chloride  (OCEAN) 0.65 % SOLN nasal spray Place 1 spray into both nostrils as needed for congestion.     No current facility-administered medications for this visit.     REVIEW OF SYSTEMS:   Constitutional: Denies fevers, chills or abnormal weight loss Eyes: Denies blurriness of vision Ears, nose, mouth, throat, and face: Denies mucositis or sore throat Respiratory: Denies cough, dyspnea or wheezes Cardiovascular: Denies palpitation, chest discomfort or lower extremity swelling Gastrointestinal:  Denies nausea, heartburn or change in bowel habits Skin: Denies abnormal skin rashes Lymphatics: Denies new  lymphadenopathy or easy bruising Neurological:Denies numbness, tingling or new weaknesses Behavioral/Psych: Mood is stable, no new changes  All other systems were reviewed with the patient and are negative.   VITALS:  Last menstrual period 10/01/2021.  Wt Readings from Last 3 Encounters:  12/01/23 212 lb 9.6 oz (96.4 kg)  11/24/23 207 lb (93.9 kg)  11/10/23 209 lb 14.1 oz (95.2 kg)    There is no height or weight on file to calculate BMI.  Performance status (ECOG): 0 - Asymptomatic  LABORATORY DATA:  I have reviewed the data as listed    Component Value Date/Time   NA 140 12/02/2023 0953   K 4.0 12/02/2023 0953   CL 102 12/02/2023 0953   CO2 21  12/02/2023 0953   GLUCOSE 88 12/02/2023 0953   GLUCOSE 122 (H) 11/11/2023 0237   BUN 14 12/02/2023 0953   CREATININE 0.62 12/02/2023 0953   CALCIUM 9.5 12/02/2023 0953   PROT 6.7 12/02/2023 0953   ALBUMIN 4.4 12/02/2023 0953   AST 34 12/02/2023 0953   ALT 82 (H) 12/02/2023 0953   ALKPHOS 179 (H) 12/02/2023 0953   BILITOT 0.4 12/02/2023 0953   GFRNONAA >60 11/11/2023 0237   GFRAA 127 10/04/2018 0908     Lab Results  Component Value Date   WBC 7.6 12/02/2023   NEUTROABS 4.7 12/02/2023   HGB 13.9 12/02/2023   HCT 42.0 12/02/2023   MCV 82 12/02/2023   PLT 269 12/02/2023

## 2023-12-29 ENCOUNTER — Encounter: Payer: Self-pay | Admitting: Nurse Practitioner

## 2023-12-29 ENCOUNTER — Inpatient Hospital Stay (HOSPITAL_BASED_OUTPATIENT_CLINIC_OR_DEPARTMENT_OTHER): Admitting: Nurse Practitioner

## 2023-12-29 DIAGNOSIS — C49A2 Gastrointestinal stromal tumor of stomach: Secondary | ICD-10-CM

## 2024-01-13 ENCOUNTER — Telehealth: Payer: Self-pay | Admitting: Hematology

## 2024-01-13 NOTE — Telephone Encounter (Signed)
 Lvm regarding appt and times

## 2024-03-14 ENCOUNTER — Inpatient Hospital Stay

## 2024-03-14 ENCOUNTER — Inpatient Hospital Stay: Admitting: Nurse Practitioner

## 2024-03-15 ENCOUNTER — Inpatient Hospital Stay: Admitting: Nurse Practitioner

## 2024-03-15 ENCOUNTER — Inpatient Hospital Stay

## 2024-03-21 ENCOUNTER — Other Ambulatory Visit: Payer: Self-pay | Admitting: Nurse Practitioner

## 2024-03-21 DIAGNOSIS — C49A2 Gastrointestinal stromal tumor of stomach: Secondary | ICD-10-CM

## 2024-03-21 NOTE — Progress Notes (Unsigned)
 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 She had upper abdominal pain, gas, and bloating. When alkaline phosphatase rise to level of 292, she was referred to GI. Initially, she had MR of the abdomen. An enhancement of the pancreatic head was seen, measuring 4.1 X 2.9 cm in diameter. Gallstones were present without evidence of gallbladder wall thickening or biliary ductal dilation. This was followed by a CT pancreas. This demonstrated a 1.9 cm polypoid lesion along the lesser curvature of the stomach. GIST tumor was felt to be possibility. She had EGD/EUS done per Dr. Wilhelmenia. Procedure confirmed the presence of polypoid lesion, originating from the muscularis propria. There were multiple gallstones noted within the gallbladder. No other abnormalities were noted.  Biopsy taken from the gastric mass indicated malignant cells present. This was comprised of mostly spindle cells which were positive for CD34 and CD117, negative for Desmin and SMA. Results were consistent with GIST tumor.  The patient underwent a partial gastrectomy on 11/10/2023. Biopsy of the mass confirmed gastrointestinal stromal tumor. It was 2.5 X 2.5 X 2 cm in diameter with negative surgical margins. This was intermediate grade without lymphatic or vascular invasion. This is considered to be a moderate risk GIST tumor, based on size 2 - 5 cm and mitotic rate of > 5/mm2. A CT of the chest done prior to surgery showed unchanged small  pulmonary nodules, largest 0.5 cm of the peripheral left lung base, nonspecific but likely benign and incidental. She has already seen her primary care provider regarding the pulmonary nodules. Close surveillance is planned.  Today, she presents for consideration of possible adjuvant treatment with Gleevec. This is antineoplastic agent used to treat tumors with C-KIT mutations. The patient is young and overall, very healthy. Will obtain evaluation for C-KIT mutation, present in nearly 90% of GIST tumors.  Her biopsies were negative for both C-KIT mutation and for PDGFRa mutation. She would not benefit from treatment with adjuvant Gleevec. Plan to follow up with her clinically every 3 - 4 months. Will repeat her CT CAP every 6 to 12 months. She understands and agrees with the plan moving forward.  03/22/2024 - she is clinically doing very well. She continues to be followed by her surgeon. CT CAP ordered as part of today's visit. Plan to follow up with labs in 3 months, sooner if needed.    Post menopausal bleeding  Patient has seen her GYN provider for this. She had sonohistogram showing single uterine polyp. Suspected to be benign. Will have removed in the near future.   Hypokalemia Very mild with potassium of 3.4 today. She reports eating banana every day because her potassium has been low in the past. She has not eaten anything yet today. May consider adding low dose potassium supplement if this becomes chronic. She denies GI symptoms such as vomiting or diarrhea.   GIST tumor of abdomen Patient is currently on surveillance. C-KIT mutation was negative. She continues to see her surgeon. Due to have surveillance CT CAP. Will order as part of today's visit.  Plan Patient is clinially doing very well.  Labs reviewed.  -unremarkable CBC and CMP.  Surveillance CT CAP. Plan for labs and follow up in 3 months, sooner if needed.    The patient understands the plans discussed today and is in agreement  with them.  She knows to contact our office if she develops concerns prior to her next appointment.  I provided 20 minutes of face-to-face time during this encounter and > 50% was spent counseling as documented under my assessment and plan.    Sarah FORBES Lessen, NP  [facility] - A DEPT OF [facility] [address] [place] [address] Dept: [phone] Dept Fax: [phone]   Orders Placed This Encounter  Procedures   CT CHEST ABDOMEN PELVIS W CONTRAST    Standing Status:   Future    Expected Date:   04/05/2024    Expiration Date:   03/22/2025    If indicated for the ordered procedure, I authorize the administration of contrast media per Radiology protocol:   Yes    Does the patient have a contrast media/X-ray dye allergy?:   No    If indicated for the ordered procedure, I authorize the administration of oral contrast media per Radiology protocol:   Yes    Preferred imaging location?:   [facility]      CHIEF COMPLAINT:  CC: GIST tumor of stomach  Current Treatment: Surveillance  INTERVAL HISTORY:  Sarah Fernandez is here today for repeat clinical assessment.  She last saw me on 12/29/2023.  CT CAP should be done February 2026.  Will order as part of today's visit.  She reports feeling very well. States that her abdominal incisions get sore sometimes, but no other pains or concerns. She had episode of light vaginal spotting. She has seen GYN provider. Imaging did show single polyp in the uterus. Believd to be benign. Will have removed in the near future. She denies chest pain, chest pressure, or shortness of breath. She denies headaches or visual disturbances. She denies abdominal pain, nausea, vomiting, or changes in bowel or bladder habits.   She denies presence of blood in stool. She does have well managed GERD which is managed by her GI provider. She denies fevers or chills. She denies pain. Her appetite is good. Her  weight has been stable.  I have reviewed the past medical history, past surgical history, social history and family history with the patient and they are unchanged from previous note.  ALLERGIES:  is allergic to flonase [fluticasone  propionate].  MEDICATIONS:  Current Outpatient Medications  Medication Sig Dispense Refill   acetaminophen  (TYLENOL ) 500 MG tablet Take 1,000 mg by mouth every 6 (six) hours as needed for moderate pain (pain score 4-6).     Cholecalciferol (VITAMIN D -3) 25 MCG (1000 UT) CAPS Take 2,000 Units by mouth daily.     famotidine  (PEPCID ) 20 MG tablet Take 20 mg by mouth at bedtime.     ketotifen  (ZADITOR ) 0.035 % ophthalmic solution Place 1 drop into both eyes in the morning and at bedtime.     Multiple Vitamins-Minerals (MULTIVITAMIN WITH MINERALS) tablet Take 1 tablet by mouth daily.     pantoprazole  (PROTONIX ) 40 MG tablet TAKE 1 TABLET BY MOUTH EVERY DAY 90 tablet 1   sodium chloride  (OCEAN) 0.65 % SOLN nasal spray Place 1 spray into both nostrils as needed for congestion.     cetirizine (ZYRTEC) 10 MG tablet  Take 10 mg by mouth daily. (Patient not taking: Reported on 03/22/2024)     No current facility-administered medications for this visit.     REVIEW OF SYSTEMS:   Constitutional: Denies fevers, chills or abnormal weight loss Eyes: Denies blurriness of vision Ears, nose, mouth, throat, and face: Denies mucositis or sore throat Respiratory: Denies cough, dyspnea or wheezes Cardiovascular: Denies palpitation, chest discomfort or lower extremity swelling Gastrointestinal:  Denies nausea, heartburn or change in bowel habits Skin: Denies abnormal skin rashes Lymphatics: Denies new lymphadenopathy or easy bruising Neurological:Denies numbness, tingling or new weaknesses Behavioral/Psych: Mood is stable, no new changes  All other systems were reviewed with the patient and are negative.   VITALS:   Today's Vitals   03/22/24 0822 03/22/24 0824  BP: (!)  156/92 (!) 127/91  Pulse: (!) 112   Resp: 17   Temp: 98.2 F (36.8 C)   SpO2: 100%   Weight: 212 lb 11.2 oz (96.5 kg)   PainSc:  0-No pain   Body mass index is 35.4 kg/m.   Wt Readings from Last 3 Encounters:  03/22/24 212 lb 11.2 oz (96.5 kg)  12/01/23 212 lb 9.6 oz (96.4 kg)  11/24/23 207 lb (93.9 kg)    Body mass index is 35.4 kg/m.  Performance status (ECOG): 1 - Symptomatic but completely ambulatory  PHYSICAL EXAM:   GENERAL:alert, no distress and comfortable SKIN: skin color, texture, turgor are normal, no rashes or significant lesions EYES: normal, Conjunctiva are pink and non-injected, sclera clear OROPHARYNX:no exudate, no erythema and lips, buccal mucosa, and tongue normal  NECK: supple, thyroid normal size, non-tender, without nodularity LYMPH:  no palpable lymphadenopathy in the cervical, axillary or inguinal LUNGS: clear to auscultation and percussion with normal breathing effort HEART: regular rate & rhythm and no murmurs and no lower extremity edema ABDOMEN:abdomen soft, non-tender and normal bowel sounds. There are 4 well healed laparoscopic scars on the abdomen. They are currently non tender.  Musculoskeletal:no cyanosis of digits and no clubbing  NEURO: alert & oriented x 3 with fluent speech, no focal motor/sensory deficits  LABORATORY DATA:  I have reviewed the data as listed    Component Value Date/Time   NA 140 03/22/2024 0754   NA 140 12/02/2023 0953   K 3.4 (L) 03/22/2024 0754   CL 102 03/22/2024 0754   CO2 24 03/22/2024 0754   GLUCOSE 128 (H) 03/22/2024 0754   BUN 13 03/22/2024 0754   BUN 14 12/02/2023 0953   CREATININE 0.78 03/22/2024 0754   CALCIUM 9.8 03/22/2024 0754   PROT 7.8 03/22/2024 0754   PROT 6.7 12/02/2023 0953   ALBUMIN 4.6 03/22/2024 0754   ALBUMIN 4.4 12/02/2023 0953   AST 22 03/22/2024 0754   ALT 32 03/22/2024 0754   ALKPHOS 141 (H) 03/22/2024 0754   BILITOT 0.4 03/22/2024 0754   GFRNONAA >60 03/22/2024 0754   GFRAA  127 10/04/2018 0908    Lab Results  Component Value Date   WBC 7.5 03/22/2024   NEUTROABS 4.9 03/22/2024   HGB 14.9 03/22/2024   HCT 44.8 03/22/2024   MCV 79.2 (L) 03/22/2024   PLT 249 03/22/2024    "

## 2024-03-21 NOTE — Assessment & Plan Note (Signed)
 Initially, she had concerns of possible gallstones. She had upper abdominal pain, gas, and bloating. When alkaline phosphatase rise to level of 292, she was referred to GI. Initially, she had MR of the abdomen. An enhancement of the pancreatic head was seen, measuring 4.1 X 2.9 cm in diameter. Gallstones were present without evidence of gallbladder wall thickening or biliary ductal dilation. This was followed by a CT pancreas. This demonstrated a 1.9 cm polypoid lesion along the lesser curvature of the stomach. GIST tumor was felt to be possibility. She had EGD/EUS done per Dr. Wilhelmenia. Procedure confirmed the presence of polypoid lesion, originating from the muscularis propria. There were multiple gallstones noted within the gallbladder. No other abnormalities were noted.  Biopsy taken from the gastric mass indicated malignant cells present. This was comprised of mostly spindle cells which were positive for CD34 and CD117, negative for Desmin and SMA. Results were consistent with GIST tumor.  The patient underwent a partial gastrectomy on 11/10/2023. Biopsy of the mass confirmed gastrointestinal stromal tumor. It was 2.5 X 2.5 X 2 cm in diameter with negative surgical margins. This was intermediate grade without lymphatic or vascular invasion. This is considered to be a moderate risk GIST tumor, based on size 2 - 5 cm and mitotic rate of > 5/mm2. A CT of the chest done prior to surgery showed unchanged small pulmonary nodules, largest 0.5 cm of the peripheral left lung base, nonspecific but likely benign and incidental. She has already seen her primary care provider regarding the pulmonary nodules. Close surveillance is planned.  Today, she presents for consideration of possible adjuvant treatment with Gleevec. This is antineoplastic agent used to treat tumors with C-KIT mutations. The patient is young and overall, very healthy. Will obtain evaluation for C-KIT mutation, present in nearly 90% of GIST tumors. If  positive, will offer her initiation of adjuvant Gleevec for 3 years. Test can be taken from current tissue biopsy. Results will take 2 to 3 weeks. Will follow up with her over the phone. If positive, will offer her Gleevec in order to prevent recurrent disease. The patient and her family members voiced understanding and agreement with this plan.

## 2024-03-22 ENCOUNTER — Inpatient Hospital Stay: Admitting: Nurse Practitioner

## 2024-03-22 ENCOUNTER — Other Ambulatory Visit: Payer: Self-pay

## 2024-03-22 ENCOUNTER — Inpatient Hospital Stay: Attending: Hematology

## 2024-03-22 ENCOUNTER — Encounter: Payer: Self-pay | Admitting: Nurse Practitioner

## 2024-03-22 VITALS — BP 127/91 | HR 112 | Temp 98.2°F | Resp 17 | Wt 212.7 lb

## 2024-03-22 DIAGNOSIS — E876 Hypokalemia: Secondary | ICD-10-CM | POA: Diagnosis not present

## 2024-03-22 DIAGNOSIS — K219 Gastro-esophageal reflux disease without esophagitis: Secondary | ICD-10-CM | POA: Insufficient documentation

## 2024-03-22 DIAGNOSIS — Z79899 Other long term (current) drug therapy: Secondary | ICD-10-CM | POA: Insufficient documentation

## 2024-03-22 DIAGNOSIS — C49A2 Gastrointestinal stromal tumor of stomach: Secondary | ICD-10-CM | POA: Insufficient documentation

## 2024-03-22 DIAGNOSIS — N950 Postmenopausal bleeding: Secondary | ICD-10-CM | POA: Diagnosis not present

## 2024-03-22 LAB — CBC WITH DIFFERENTIAL (CANCER CENTER ONLY)
Abs Immature Granulocytes: 0.01 K/uL (ref 0.00–0.07)
Basophils Absolute: 0 K/uL (ref 0.0–0.1)
Basophils Relative: 1 %
Eosinophils Absolute: 0.1 K/uL (ref 0.0–0.5)
Eosinophils Relative: 2 %
HCT: 44.8 % (ref 36.0–46.0)
Hemoglobin: 14.9 g/dL (ref 12.0–15.0)
Immature Granulocytes: 0 %
Lymphocytes Relative: 26 %
Lymphs Abs: 1.9 K/uL (ref 0.7–4.0)
MCH: 26.3 pg (ref 26.0–34.0)
MCHC: 33.3 g/dL (ref 30.0–36.0)
MCV: 79.2 fL — ABNORMAL LOW (ref 80.0–100.0)
Monocytes Absolute: 0.6 K/uL (ref 0.1–1.0)
Monocytes Relative: 8 %
Neutro Abs: 4.9 K/uL (ref 1.7–7.7)
Neutrophils Relative %: 63 %
Platelet Count: 249 K/uL (ref 150–400)
RBC: 5.66 MIL/uL — ABNORMAL HIGH (ref 3.87–5.11)
RDW: 14.2 % (ref 11.5–15.5)
WBC Count: 7.5 K/uL (ref 4.0–10.5)
nRBC: 0 % (ref 0.0–0.2)

## 2024-03-22 LAB — CMP (CANCER CENTER ONLY)
ALT: 32 U/L (ref 0–44)
AST: 22 U/L (ref 15–41)
Albumin: 4.6 g/dL (ref 3.5–5.0)
Alkaline Phosphatase: 141 U/L — ABNORMAL HIGH (ref 38–126)
Anion gap: 14 (ref 5–15)
BUN: 13 mg/dL (ref 6–20)
CO2: 24 mmol/L (ref 22–32)
Calcium: 9.8 mg/dL (ref 8.9–10.3)
Chloride: 102 mmol/L (ref 98–111)
Creatinine: 0.78 mg/dL (ref 0.44–1.00)
GFR, Estimated: >60 mL/min
Glucose, Bld: 128 mg/dL — ABNORMAL HIGH (ref 70–99)
Potassium: 3.4 mmol/L — ABNORMAL LOW (ref 3.5–5.1)
Sodium: 140 mmol/L (ref 135–145)
Total Bilirubin: 0.4 mg/dL (ref 0.0–1.2)
Total Protein: 7.8 g/dL (ref 6.5–8.1)

## 2024-04-07 ENCOUNTER — Telehealth: Payer: Self-pay

## 2024-04-07 ENCOUNTER — Other Ambulatory Visit: Payer: Self-pay

## 2024-04-07 DIAGNOSIS — E119 Type 2 diabetes mellitus without complications: Secondary | ICD-10-CM

## 2024-04-07 DIAGNOSIS — I10 Essential (primary) hypertension: Secondary | ICD-10-CM

## 2024-04-07 DIAGNOSIS — E782 Mixed hyperlipidemia: Secondary | ICD-10-CM

## 2024-04-07 NOTE — Telephone Encounter (Signed)
 Copied from CRM [id number]. Topic: Clinical - Request for Lab/Test Order >> Apr 07, 2024 12:18 PM Winona R wrote: Pt scheduled for a 59month follow up however not sure what labs need to be done prior to, unaware if its the lab order that is in date 10/01

## 2024-04-08 ENCOUNTER — Ambulatory Visit (HOSPITAL_COMMUNITY)

## 2024-04-22 ENCOUNTER — Ambulatory Visit (HOSPITAL_COMMUNITY)

## 2024-05-11 ENCOUNTER — Ambulatory Visit

## 2024-05-24 ENCOUNTER — Ambulatory Visit

## 2024-06-21 ENCOUNTER — Inpatient Hospital Stay: Attending: Hematology

## 2024-06-21 ENCOUNTER — Inpatient Hospital Stay: Admitting: Nurse Practitioner
